# Patient Record
Sex: Female | Born: 1980 | Race: White | Hispanic: No | Marital: Married | Smoking: Current every day smoker
Health system: Southern US, Community
[De-identification: ages and names within clinical notes are randomized; demographics above are authoritative.]

## PROBLEM LIST (undated history)

## (undated) DIAGNOSIS — F419 Anxiety disorder, unspecified: Secondary | ICD-10-CM

## (undated) HISTORY — PX: HAND SURGERY: SHX662

## (undated) HISTORY — PX: LEG SURGERY: SHX1003

## (undated) HISTORY — PX: TONSILLECTOMY: SUR1361

## (undated) HISTORY — PX: OTHER SURGICAL HISTORY: SHX169

---

## 2002-01-05 ENCOUNTER — Other Ambulatory Visit: Admission: RE | Admit: 2002-01-05 | Discharge: 2002-01-05 | Payer: Self-pay | Admitting: Obstetrics and Gynecology

## 2002-01-26 ENCOUNTER — Inpatient Hospital Stay (HOSPITAL_COMMUNITY): Admission: AD | Admit: 2002-01-26 | Discharge: 2002-01-26 | Payer: Self-pay | Admitting: Obstetrics and Gynecology

## 2002-02-23 ENCOUNTER — Inpatient Hospital Stay (HOSPITAL_COMMUNITY): Admission: AD | Admit: 2002-02-23 | Discharge: 2002-02-23 | Payer: Self-pay | Admitting: Obstetrics and Gynecology

## 2002-03-17 ENCOUNTER — Inpatient Hospital Stay (HOSPITAL_COMMUNITY): Admission: AD | Admit: 2002-03-17 | Discharge: 2002-03-20 | Payer: Self-pay | Admitting: Obstetrics and Gynecology

## 2002-03-28 ENCOUNTER — Inpatient Hospital Stay (HOSPITAL_COMMUNITY): Admission: AD | Admit: 2002-03-28 | Discharge: 2002-03-28 | Payer: Self-pay | Admitting: Obstetrics and Gynecology

## 2002-05-07 ENCOUNTER — Other Ambulatory Visit: Admission: RE | Admit: 2002-05-07 | Discharge: 2002-05-07 | Payer: Self-pay | Admitting: Obstetrics and Gynecology

## 2003-06-01 ENCOUNTER — Other Ambulatory Visit: Admission: RE | Admit: 2003-06-01 | Discharge: 2003-06-01 | Payer: Self-pay | Admitting: Obstetrics and Gynecology

## 2004-06-07 ENCOUNTER — Emergency Department (HOSPITAL_COMMUNITY): Admission: AC | Admit: 2004-06-07 | Discharge: 2004-06-07 | Payer: Self-pay

## 2004-06-13 ENCOUNTER — Inpatient Hospital Stay (HOSPITAL_COMMUNITY): Admission: EM | Admit: 2004-06-13 | Discharge: 2004-06-16 | Payer: Self-pay | Admitting: Emergency Medicine

## 2004-06-13 ENCOUNTER — Ambulatory Visit: Payer: Self-pay | Admitting: Physical Medicine & Rehabilitation

## 2004-06-16 ENCOUNTER — Inpatient Hospital Stay (HOSPITAL_COMMUNITY)
Admission: RE | Admit: 2004-06-16 | Discharge: 2004-06-22 | Payer: Self-pay | Admitting: Physical Medicine & Rehabilitation

## 2004-09-20 ENCOUNTER — Emergency Department (HOSPITAL_COMMUNITY): Admission: EM | Admit: 2004-09-20 | Discharge: 2004-09-20 | Payer: Self-pay | Admitting: Emergency Medicine

## 2004-10-16 ENCOUNTER — Other Ambulatory Visit: Admission: RE | Admit: 2004-10-16 | Discharge: 2004-10-16 | Payer: Self-pay | Admitting: Obstetrics and Gynecology

## 2004-11-18 ENCOUNTER — Emergency Department (HOSPITAL_COMMUNITY): Admission: EM | Admit: 2004-11-18 | Discharge: 2004-11-18 | Payer: Self-pay | Admitting: Emergency Medicine

## 2005-05-27 ENCOUNTER — Emergency Department (HOSPITAL_COMMUNITY): Admission: EM | Admit: 2005-05-27 | Discharge: 2005-05-27 | Payer: Self-pay | Admitting: Emergency Medicine

## 2005-06-05 ENCOUNTER — Encounter: Admission: RE | Admit: 2005-06-05 | Discharge: 2005-09-03 | Payer: Self-pay | Admitting: Orthopedic Surgery

## 2005-09-06 ENCOUNTER — Ambulatory Visit (HOSPITAL_COMMUNITY): Admission: RE | Admit: 2005-09-06 | Discharge: 2005-09-06 | Payer: Self-pay | Admitting: Obstetrics and Gynecology

## 2006-03-19 ENCOUNTER — Emergency Department (HOSPITAL_COMMUNITY): Admission: EM | Admit: 2006-03-19 | Discharge: 2006-03-20 | Payer: Self-pay | Admitting: Emergency Medicine

## 2007-06-11 ENCOUNTER — Emergency Department (HOSPITAL_COMMUNITY): Admission: EM | Admit: 2007-06-11 | Discharge: 2007-06-11 | Payer: Self-pay | Admitting: Emergency Medicine

## 2007-09-06 ENCOUNTER — Inpatient Hospital Stay (HOSPITAL_COMMUNITY): Admission: AD | Admit: 2007-09-06 | Discharge: 2007-09-06 | Payer: Self-pay | Admitting: Obstetrics and Gynecology

## 2007-10-05 ENCOUNTER — Inpatient Hospital Stay (HOSPITAL_COMMUNITY): Admission: AD | Admit: 2007-10-05 | Discharge: 2007-10-11 | Payer: Self-pay | Admitting: Obstetrics and Gynecology

## 2007-10-07 ENCOUNTER — Encounter (INDEPENDENT_AMBULATORY_CARE_PROVIDER_SITE_OTHER): Payer: Self-pay | Admitting: Obstetrics and Gynecology

## 2008-10-23 ENCOUNTER — Ambulatory Visit: Payer: Self-pay | Admitting: Interventional Radiology

## 2008-10-23 ENCOUNTER — Emergency Department (HOSPITAL_BASED_OUTPATIENT_CLINIC_OR_DEPARTMENT_OTHER): Admission: EM | Admit: 2008-10-23 | Discharge: 2008-10-23 | Payer: Self-pay | Admitting: Emergency Medicine

## 2009-01-25 ENCOUNTER — Emergency Department (HOSPITAL_BASED_OUTPATIENT_CLINIC_OR_DEPARTMENT_OTHER): Admission: EM | Admit: 2009-01-25 | Discharge: 2009-01-25 | Payer: Self-pay | Admitting: Emergency Medicine

## 2009-01-25 ENCOUNTER — Ambulatory Visit: Payer: Self-pay | Admitting: Radiology

## 2009-02-01 ENCOUNTER — Emergency Department (HOSPITAL_BASED_OUTPATIENT_CLINIC_OR_DEPARTMENT_OTHER): Admission: EM | Admit: 2009-02-01 | Discharge: 2009-02-01 | Payer: Self-pay | Admitting: Emergency Medicine

## 2009-09-25 ENCOUNTER — Emergency Department (HOSPITAL_COMMUNITY): Admission: EM | Admit: 2009-09-25 | Discharge: 2009-09-25 | Payer: Self-pay | Admitting: Emergency Medicine

## 2010-02-15 ENCOUNTER — Inpatient Hospital Stay (HOSPITAL_COMMUNITY): Admission: AD | Admit: 2010-02-15 | Discharge: 2010-02-15 | Payer: Self-pay | Admitting: Obstetrics and Gynecology

## 2010-02-24 ENCOUNTER — Inpatient Hospital Stay (HOSPITAL_COMMUNITY): Admission: RE | Admit: 2010-02-24 | Discharge: 2010-02-27 | Payer: Self-pay | Admitting: Obstetrics and Gynecology

## 2010-06-13 LAB — CBC
HCT: 26.2 % — ABNORMAL LOW (ref 36.0–46.0)
HCT: 32.2 % — ABNORMAL LOW (ref 36.0–46.0)
Hemoglobin: 8.6 g/dL — ABNORMAL LOW (ref 12.0–15.0)
MCH: 28.6 pg (ref 26.0–34.0)
MCH: 28.8 pg (ref 26.0–34.0)
MCHC: 33 g/dL (ref 30.0–36.0)
MCV: 86.8 fL (ref 78.0–100.0)
Platelets: 241 10*3/uL (ref 150–400)
RBC: 3.71 MIL/uL — ABNORMAL LOW (ref 3.87–5.11)

## 2010-06-13 LAB — SURGICAL PCR SCREEN: Staphylococcus aureus: POSITIVE — AB

## 2010-07-09 LAB — PREGNANCY, URINE: Preg Test, Ur: NEGATIVE
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
## 2010-08-15 NOTE — Op Note (Signed)
NAME:  Monica Jones, Monica Jones NO.:  [id number]   MEDICAL RECORD NO.:  [id number]          PATIENT TYPE:  INP   LOCATION:  9306                          [facility]  [facility]   PHYSICIAN:  Huel Cote, M.D. DATE OF BIRTH:  05/01/1980   DATE OF PROCEDURE:  10/07/2007  DATE OF DISCHARGE:                               OPERATIVE REPORT   PREOPERATIVE DIAGNOSES:  1. Preterm gestation at 27-4/7th weeks.  2. Preterm labor failing tocolysis with magnesium.  3. History of prior cesarean section.  4. Uterine anomaly precluding vaginal delivery.   POSTOPERATIVE DIAGNOSES:  1. Preterm gestation at 27-4/7th weeks.  2. Preterm labor failing tocolysis with magnesium.  3. History of prior cesarean section.  4. Uterine anomaly precluding vaginal delivery.  5. Complete vertical septum noted through the uterus and the vagina      one uterine body.   PROCEDURE:  Repeat low-transverse C-section with 2-layer closure of the  uterus.   SURGEON:  Huel Cote, MD   ANESTHESIA:  Spinal.   FINDINGS:  As stated, there was one uterine corpus noted with normal  tubes and ovaries bilaterally.  There was a complete vertical septum  extending down through the fundus through the cervix and the vagina.  The pregnancy was noted to be primarily in the left side of the uterus,  however, the cervix was dilating on both sides of the septum.  There was  a viable female infant in vertex presentation.  Apgars were 9 and 9.  Weight was 1027 grams.   SPECIMEN:  Placenta to pathology.   ESTIMATED BLOOD LOSS:  750 mL.   URINE OUTPUT:  150 mL clear urine.   IV FLUIDS:  2800 mL LR.   PROCEDURE IN DETAIL:  The patient was taken to the operating room where  a spinal anesthesia was obtained without difficulty.  She was then  prepped and draped in normal sterile fashion in the dorsal supine  position with a leftward tilt.  A Pfannenstiel skin incision was then  made through a preexisting scar and carried  down to the underlying layer  of fascia by sharp dissection and Bovie cautery.  The fascia was then  nicked in the midline and incision was extended laterally with Mayo  scissors.  The inferior aspect was then grasped with Kocher clamps,  elevated, and dissected off the underlying rectus muscles.  In a similar  fashion, the superior aspect was dissected off the rectus muscles.  These were then separated in midline and the peritoneal cavity entered  bluntly.  There were some omental adhesions noted which were taken down  with Bovie cautery.  The peritoneal incision was then extended both  superiorly and inferiorly with careful attention to avoid both bowel and  bladder.  The Alexis self-retaining wound retractor was then placed  within the abdomen and the lower uterine segment exposed nicely.  The  bladder flap was then developed secondary to some adhesions of the  bladder and the bladder pushed well away from lower uterine segment.  The lower uterine segment appeared normal and was then opened in  transverse fashion  with a scalpel and the cavity itself entered bluntly.  This was then extended and the infant's head was elevated through the  incision and delivered without difficulty.  The remainder of the body  was delivered and the cord clamped and cut and handed off to waiting  pediatricians.  The placenta and infant were noted to come from the left  side of the uterus and the right side of the septum was an empty cavity  with no pregnancy tissue within it.  The uterus was cleared of all clots  and debris on both sides with a moist lap sponge.  The uterine septum  was noted to travel all the way down to the level of the incision and  appeared to go down to the level of the cervix as well.  The uterine  incision was then closed with 0 Vicryl in a running locked suture in one  layer and imbricating layer of the same in the second layer.  All  appeared hemostatic.  The uterus was then  returned to the abdomen and  the gutters cleared of all clots and debris with moist lap sponge.  The  fascia was inspected and the rectus muscles inspected and found to be  hemostatic.  The peritoneum was then closed with 0 Vicryl and several  interrupted sutures.  The fascia was closed with 0 Vicryl in a running  fashion and the skin was closed with staples.  Sponge, lap, and needle  counts were correct x2 and the patient was taken to the recovery room in  stable condition.      Huel Cote, M.D.  Electronically Signed     KR/MEDQ  D:  10/07/2007  T:  10/08/2007  Job:  [id number]
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
## 2010-08-18 NOTE — H&P (Signed)
   NAME:  Monica Jones, Monica Jones                        ACCOUNT NO.:  [id number]   MEDICAL RECORD NO.:  [id number]                   PATIENT TYPE:  INP   LOCATION:  9132                                 [facility]   PHYSICIAN:  Tracie Harrier, M.D.              DATE OF BIRTH:  [birth year]   DATE OF ADMISSION:  03/17/2002  DATE OF DISCHARGE:                                HISTORY & PHYSICAL   HISTORY OF PRESENT ILLNESS:  Ms. Benna Dunks is a 30 year old female, Gravida II,  Para 0, Ab 1 at [redacted] weeks gestation. The patient was admitted after  spontaneous rupture of membranes and spontaneous onset of labor today at  about 12:30. She had a large gush of clear fluid about 12:30 and began  contracting thereafter. She was initially scheduled for Cesarean section on  March 18, 2002 for breech presentation. She is now admitted for primary C-  section for spontaneous rupture of membranes and breech presentation and  known uterine abnormality.   OBSTETRIC LABORATORY:  Maternal blood type 0+, Rubella immune, Glucola  normal, group B strep unknown.   PAST MEDICAL HISTORY:  History of migraine headaches. History of known  uterine abnormality.   SURGICAL HISTORY:  1. TAB times one.  2. Tonsillectomy and adenoidectomy.  3. Ear surgery and tongue clipped.   OB HISTORY:  TAB times one at six weeks.   CURRENT MEDICATIONS:  Prenatal vitamins.   ALLERGIES:  No known drug allergies.   PHYSICAL EXAMINATION:  VITAL SIGNS: Stable. Temperature afebrile. Blood  pressure 108/60. Fetal heart tones are 140's.  GENERAL: A well developed, well nourished female in no acute distress.  HEENT:  Within normal limits.  NECK: Supple. Without adenopathy or thyromegaly.  HEART: Regular rate and rhythm. No murmur, rub, or gallop.  LUNGS: Clear.  BREASTS: Examination deferred.  ABDOMEN: Gravid and nontender.  EXTREMITIES: Normal.  NEURO: Grossly normal.  PELVIC: Normal external female genitalia noted. Obvious gross  rupture of  membranes is noted. Cervical examination per nurse examiner was 2 cm but I  did not examine the patient.   ADMISSION DIAGNOSES:  1. Intrauterine pregnancy at 38 weeks.  2. Spontaneous rupture of membranes.  3. Breech presentation.  4. Known uterine abnormality.   PLAN:  Primary low transverse C-section. The risks and benefits of this  surgery explained to the patient and her husband. Questions are answered.                                                 Tracie Harrier, M.D.    REG/MEDQ  D:  03/17/2002  T:  03/18/2002  Job:  [id number]

## 2010-08-18 NOTE — Op Note (Signed)
NAME:  Monica Jones, Monica Jones                        ACCOUNT NO.:  [id number]   MEDICAL RECORD NO.:  [id number]                   PATIENT TYPE:  INP   LOCATION:  9132                                 [facility]   PHYSICIAN:  Tracie Harrier, M.D.              DATE OF BIRTH:  April 18, 1980   DATE OF PROCEDURE:  04/01/2002  DATE OF DISCHARGE:                                 OPERATIVE REPORT   PREOPERATIVE DIAGNOSES:  1. Intrauterine pregnancy at 38 weeks.  2. Spontaneous rupture of membranes.  3. Breech presentation.  4. Known uterine abnormality.   POSTOPERATIVE DIAGNOSES:  1. Intrauterine pregnancy at 38 weeks.  2. Spontaneous rupture of membranes.  3. Footling breech presentation.  4. Known uterine abnormality.   PROCEDURE:  Primary low transverse cesarean section.   SURGEON:  Tracie Harrier, M.D.   ANESTHESIA:  Spinal.   ESTIMATED BLOOD LOSS:  500 cc.   COMPLICATIONS:  None.   FINDINGS:  At 31 through a low transverse uterine incision, a viable female  infant was delivered from the footling breech presentation.  This was done  with the usual breech maneuvers.  Weight is pending.  Apgars were 9 and 9.  Cord pH 7.26.   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  This extended  down the entire length to the incision of the uterus.   The ovaries and tubes were normal bilaterally.   DESCRIPTION OF PROCEDURE:  The patient was taken to the operating room where  a spinal anesthetic was administered.  The patient was placed on the  operating table in the left lateral tilt position.  The abdomen was prepped  and draped in the usual sterile fashion with Betadine and sterile drapes.  A  Foley catheter was inserted.  The abdomen was then entered through a  Pfannenstiel incision and carried down sharply in the usual fashion.  The  peritoneum was atraumatically entered.  The vesicouterine peritoneum  overlying the lower uterine segment was incised, and a bladder  flap was  bluntly and sharply created over the lower uterine segment.  A bladder blade  was then placed behind the bladder to ensure its protection during the  procedure.  The uterus was entered and the incision was carried out  laterally using the operating fingers.  The footling breech was delivered  with the usual breech maneuvers and delivered promptly and easily at 1429.  The oral and nasal pharynx were thoroughly bulb suctioned, and the cord  doubly clamped and cut.  The baby was handed promptly to the pediatricians.  There was noted upon entry into the uterus a scant amount of fluid.   The baby did well.  Apgars were 9 and 9.  Weight is pending.  The pH is  7.26.  As mentioned, a septate uterus was encountered.   The uterine incision was then closed in a two layer fashion.  The first  layer was a running interlocking suture of #1 Vicryl.  A second imbricating  suture was placed across the primary suture line with a running stitch of #1  Vicryl as well.  Multiple interrupted sutures of #1 Vicryl were used to  achieve hemostasis.  The pelvis was then thoroughly irrigated and noted to  be hemostatic.   Attention was then turned to closure.  The rectus muscle and anterior  peritoneum was closed with a running stitch of 0 Monocryl.  The fascia was  then closed with a running suture of #1 Vicryl.  The subcutaneous tissue was  irrigated and made hemostatic using Bovie cautery.  The skin was  reapproximated with staples, and a sterile dressing applied.   Final sponge, needle, and instrument counts were correct x3.  There were no  perioperative complications.                                               Tracie Harrier, M.D.    REG/MEDQ  D:  03/17/2002  T:  03/17/2002  Job:  [id number]

## 2010-08-18 NOTE — Discharge Summary (Signed)
NAME:  LUCILLE, WITTS                        ACCOUNT NO.:  [id number]   MEDICAL RECORD NO.:  [id number]                   PATIENT TYPE:  INP   LOCATION:  9132                                 [facility]   PHYSICIAN:  Duke Salvia. Marcelle Overlie, M.D.            DATE OF BIRTH:  07-08-1980   DATE OF ADMISSION:  03/17/2002  DATE OF DISCHARGE:  03/20/2002                                 DISCHARGE SUMMARY   ADMISSION DIAGNOSES:  1. Intrauterine pregnancy at 25 weeks estimated gestational age.  2. Spontaneous rupture of membranes.  3. Breech presentation.  4. Known uterine abnormality.   DISCHARGE DIAGNOSES:  1. Status post low transverse cesarean section.  2. Viable female infant.   PROCEDURES:  Primary low transverse cesarean section.   REASON FOR ADMISSION:  Please see dictated H&P.   HISTORY OF PRESENT ILLNESS:  The patient was a 30 year old, gravida 2, para  0, who was admitted to the [facility] at 38 weeks estimated  gestational age with spontaneous rupture of membranes and onset of labor.  The fetus was in the breech presentation and the patient had known septated  uterus.  Cesarean section was scheduled.   HOSPITAL COURSE:  The patient was taken to the operating room where a spinal  anesthesia was administered without difficulty.  A low transverse incision  was made with delivery of a viable female infant weighing 6 pounds 16 ounces  with Apgars of 9 at one minute and 9 at five minutes.  The umbilical cord pH  was 7.26.  The patient tolerated the procedure well and was taken to the  recovery room in stable condition.  On postoperative day #1, the patient had  good return of bowel function.  The abdomen was soft and the fundus was firm  and nontender.  The patient was tolerating a regular diet without complaints  of nausea or vomiting.  Labs revealed a hemoglobin of 8.8, a platelet count  of 171,000, and a WBC count of 13.0.  On postoperative day #2, the patient  was ambulating without assistance.  The abdominal dressing was removed,  revealing an incision that was clean, dry, and intact.  On postoperative day  #3, the patient was doing well.  She was tolerating pain medications with  good relief.  The abdomen was soft.  The incision was clean, dry, and  intact.  The staples were removed and the patient was discharged home.   CONDITION ON DISCHARGE:  Good.   DIET:  Regular as tolerated.   ACTIVITY:  No heaving lifting.  No driving x 2 weeks.  No vaginal entry.   FOLLOW-UP:  The patient is to follow up in the office in one to two weeks  for an incision check.  She is to call for a temperature greater than 100  degrees, persistent nausea and vomiting, heavy vaginal bleeding and/or  redness or drainage from the incision site.  DISCHARGE MEDICATIONS:  1. Percocet 5/325 mg, #30, one p.o. every four to six hours pain.  2. Motrin 600 mg one every six hours p.r.n.  3. Prenatal vitamins one p.o. daily.  4. Colace one p.o. daily p.r.n.     Julio Sicks, N.P.                        Richard M. Marcelle Overlie, M.D.    CC/MEDQ  D:  04/20/2002  T:  04/20/2002  Job:  [id number]

## 2010-12-25 LAB — URINALYSIS, ROUTINE W REFLEX MICROSCOPIC
Bilirubin Urine: NEGATIVE
Glucose, UA: NEGATIVE
Ketones, ur: NEGATIVE
pH: 6

## 2010-12-25 LAB — URINE MICROSCOPIC-ADD ON

## 2010-12-28 LAB — CBC
HCT: 32.4 — ABNORMAL LOW
HCT: 26.6 — ABNORMAL LOW
Hemoglobin: 11.2 — ABNORMAL LOW
Hemoglobin: 10.6 — ABNORMAL LOW
MCV: 93.8
MCV: 95.1
Platelets: 220
Platelets: 237
RBC: 2.8 — ABNORMAL LOW
RBC: 3.27 — ABNORMAL LOW
RBC: 3.56 — ABNORMAL LOW
WBC: 13.7 — ABNORMAL HIGH
WBC: 14.5 — ABNORMAL HIGH
WBC: 20.5 — ABNORMAL HIGH

## 2010-12-28 LAB — URINE MICROSCOPIC-ADD ON

## 2010-12-28 LAB — DIFFERENTIAL
Eosinophils Absolute: 0.1
Eosinophils Relative: 1
Lymphs Abs: 3.6
Monocytes Absolute: 1.2 — ABNORMAL HIGH

## 2010-12-28 LAB — URINALYSIS, ROUTINE W REFLEX MICROSCOPIC
Glucose, UA: NEGATIVE
Protein, ur: NEGATIVE
Specific Gravity, Urine: 1.015
pH: 7.5

## 2010-12-28 LAB — MAGNESIUM
Magnesium: 6.6
Magnesium: 6.9

## 2010-12-28 LAB — GC/CHLAMYDIA PROBE AMP, GENITAL: Chlamydia, DNA Probe: NEGATIVE

## 2010-12-28 LAB — WET PREP, GENITAL: Yeast Wet Prep HPF POC: NONE SEEN

## 2010-12-28 LAB — RPR: RPR Ser Ql: NONREACTIVE

## 2010-12-28 LAB — HEPATITIS B SURFACE ANTIGEN: Hepatitis B Surface Ag: NEGATIVE

## 2011-10-18 ENCOUNTER — Encounter (HOSPITAL_BASED_OUTPATIENT_CLINIC_OR_DEPARTMENT_OTHER): Payer: Self-pay | Admitting: *Deleted

## 2011-10-18 ENCOUNTER — Emergency Department (HOSPITAL_BASED_OUTPATIENT_CLINIC_OR_DEPARTMENT_OTHER): Payer: Medicaid Other

## 2011-10-18 ENCOUNTER — Emergency Department (HOSPITAL_BASED_OUTPATIENT_CLINIC_OR_DEPARTMENT_OTHER)
Admission: EM | Admit: 2011-10-18 | Discharge: 2011-10-19 | Disposition: A | Discharge status: Home / Self Care | Payer: Medicaid Other | Attending: Emergency Medicine | Admitting: Emergency Medicine

## 2011-10-18 DIAGNOSIS — S93609A Unspecified sprain of unspecified foot, initial encounter: Secondary | ICD-10-CM

## 2011-10-18 DIAGNOSIS — Z79899 Other long term (current) drug therapy: Secondary | ICD-10-CM | POA: Insufficient documentation

## 2011-10-18 DIAGNOSIS — X58XXXA Exposure to other specified factors, initial encounter: Secondary | ICD-10-CM | POA: Insufficient documentation

## 2011-10-18 DIAGNOSIS — Y93K1 Activity, walking an animal: Secondary | ICD-10-CM | POA: Insufficient documentation

## 2011-10-18 DIAGNOSIS — F17200 Nicotine dependence, unspecified, uncomplicated: Secondary | ICD-10-CM | POA: Insufficient documentation

## 2011-10-18 DIAGNOSIS — F411 Generalized anxiety disorder: Secondary | ICD-10-CM | POA: Insufficient documentation

## 2011-10-18 HISTORY — DX: Anxiety disorder, unspecified: F41.9

## 2011-10-18 NOTE — ED Notes (Signed)
Larey Seat in a hole this am and injury to her left foot.

## 2011-10-19 MED ORDER — IBUPROFEN 800 MG PO TABS
800.0000 mg | ORAL_TABLET | Freq: Once | ORAL | Status: AC
  Administered 2011-10-19: 800 mg via ORAL
  Filled 2011-10-19: qty 1

## 2011-10-19 MED ORDER — NAPROXEN 500 MG PO TABS: 500.0000 mg | ORAL_TABLET | Freq: Two times a day (BID) | ORAL | Status: AC | PRN

## 2011-10-19 MED ORDER — OXYCODONE-ACETAMINOPHEN 5-325 MG PO TABS
2.0000 | ORAL_TABLET | Freq: Once | ORAL | Status: AC
  Administered 2011-10-19: 2 via ORAL
  Filled 2011-10-19: qty 2

## 2011-10-19 MED ORDER — OXYCODONE-ACETAMINOPHEN 5-325 MG PO TABS: 1.0000 | ORAL_TABLET | ORAL | Status: AC | PRN
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
## 2014-11-11 ENCOUNTER — Encounter (HOSPITAL_COMMUNITY): Payer: Self-pay | Admitting: *Deleted

## 2014-11-11 ENCOUNTER — Emergency Department (HOSPITAL_COMMUNITY)
Admission: EM | Admit: 2014-11-11 | Discharge: 2014-11-12 | Disposition: A | Discharge status: Home / Self Care | Payer: Medicaid Other | Attending: Emergency Medicine | Admitting: Emergency Medicine

## 2014-11-11 DIAGNOSIS — F419 Anxiety disorder, unspecified: Secondary | ICD-10-CM | POA: Diagnosis not present

## 2014-11-11 DIAGNOSIS — Z720 Tobacco use: Secondary | ICD-10-CM | POA: Diagnosis not present

## 2014-11-11 DIAGNOSIS — Z79899 Other long term (current) drug therapy: Secondary | ICD-10-CM | POA: Insufficient documentation

## 2014-11-11 DIAGNOSIS — D509 Iron deficiency anemia, unspecified: Secondary | ICD-10-CM | POA: Diagnosis not present

## 2014-11-11 DIAGNOSIS — Z8759 Personal history of other complications of pregnancy, childbirth and the puerperium: Secondary | ICD-10-CM

## 2014-11-11 DIAGNOSIS — R42 Dizziness and giddiness: Secondary | ICD-10-CM | POA: Diagnosis present

## 2014-11-11 LAB — IRON AND TIBC
Iron: 12 ug/dL — ABNORMAL LOW (ref 28–170)
Saturation Ratios: 3 % — ABNORMAL LOW (ref 10.4–31.8)
TIBC: 370 ug/dL (ref 250–450)
UIBC: 358 ug/dL

## 2014-11-11 LAB — CBC WITH DIFFERENTIAL/PLATELET
BASOS ABS: 0 10*3/uL (ref 0.0–0.1)
BASOS PCT: 0 % (ref 0–1)
EOS ABS: 0.2 10*3/uL (ref 0.0–0.7)
Eosinophils Relative: 2 % (ref 0–5)
HCT: 24.6 % — ABNORMAL LOW (ref 36.0–46.0)
Hemoglobin: 8.3 g/dL — ABNORMAL LOW (ref 12.0–15.0)
Lymphocytes Relative: 44 % (ref 12–46)
Lymphs Abs: 4.4 10*3/uL — ABNORMAL HIGH (ref 0.7–4.0)
MCH: 29.7 pg (ref 26.0–34.0)
MCHC: 33.7 g/dL (ref 30.0–36.0)
MCV: 88.2 fL (ref 78.0–100.0)
Monocytes Absolute: 0.9 10*3/uL (ref 0.1–1.0)
Monocytes Relative: 9 % (ref 3–12)
NEUTROS PCT: 45 % (ref 43–77)
Neutro Abs: 4.5 10*3/uL (ref 1.7–7.7)
PLATELETS: 352 10*3/uL (ref 150–400)
RBC: 2.79 MIL/uL — AB (ref 3.87–5.11)
RDW: 15.6 % — AB (ref 11.5–15.5)
WBC: 10 10*3/uL (ref 4.0–10.5)

## 2014-11-11 LAB — URINALYSIS, ROUTINE W REFLEX MICROSCOPIC
BILIRUBIN URINE: NEGATIVE
Glucose, UA: NEGATIVE mg/dL
KETONES UR: NEGATIVE mg/dL
NITRITE: NEGATIVE
Protein, ur: NEGATIVE mg/dL
Specific Gravity, Urine: 1.008 (ref 1.005–1.030)
UROBILINOGEN UA: 0.2 mg/dL (ref 0.0–1.0)
pH: 6.5 (ref 5.0–8.0)

## 2014-11-11 LAB — RETICULOCYTES
RBC.: 2.77 MIL/uL — ABNORMAL LOW (ref 3.87–5.11)
Retic Count, Absolute: 80.3 K/uL (ref 19.0–186.0)
Retic Ct Pct: 2.9 % (ref 0.4–3.1)

## 2014-11-11 LAB — COMPREHENSIVE METABOLIC PANEL WITH GFR
ALT: 37 U/L (ref 14–54)
AST: 26 U/L (ref 15–41)
Albumin: 3.2 g/dL — ABNORMAL LOW (ref 3.5–5.0)
Alkaline Phosphatase: 56 U/L (ref 38–126)
Anion gap: 8 (ref 5–15)
BUN: 5 mg/dL — ABNORMAL LOW (ref 6–20)
CO2: 27 mmol/L (ref 22–32)
Calcium: 8.6 mg/dL — ABNORMAL LOW (ref 8.9–10.3)
Chloride: 101 mmol/L (ref 101–111)
Creatinine, Ser: 0.51 mg/dL (ref 0.44–1.00)
GFR calc Af Amer: >60 mL/min
GFR calc non Af Amer: >60 mL/min
Glucose, Bld: 112 mg/dL — ABNORMAL HIGH (ref 65–99)
Potassium: 3.3 mmol/L — ABNORMAL LOW (ref 3.5–5.1)
Sodium: 136 mmol/L (ref 135–145)
Total Bilirubin: 0.4 mg/dL (ref 0.3–1.2)
Total Protein: 6 g/dL — ABNORMAL LOW (ref 6.5–8.1)

## 2014-11-11 LAB — FOLATE: FOLATE: 12.5 ng/mL (ref >5.9)

## 2014-11-11 LAB — URINE MICROSCOPIC-ADD ON

## 2014-11-11 LAB — FERRITIN: Ferritin: 5 ng/mL — ABNORMAL LOW (ref 11–307)

## 2014-11-11 LAB — VITAMIN B12: Vitamin B-12: 514 pg/mL (ref 180–914)

## 2014-11-11 MED ORDER — FERROUS SULFATE 325 (65 FE) MG PO TABS
325.0000 mg | ORAL_TABLET | Freq: Once | ORAL | Status: AC
  Administered 2014-11-11: 325 mg via ORAL
  Filled 2014-11-11: qty 1

## 2014-11-11 MED ORDER — POTASSIUM CHLORIDE CRYS ER 20 MEQ PO TBCR
40.0000 meq | EXTENDED_RELEASE_TABLET | Freq: Once | ORAL | Status: AC
  Administered 2014-11-11: 40 meq via ORAL
  Filled 2014-11-11: qty 2

## 2014-11-11 MED ORDER — FERROUS SULFATE 325 (65 FE) MG PO TABS: ORAL_TABLET | ORAL | Status: DC

## 2014-11-11 MED ORDER — SODIUM CHLORIDE 0.9 % IV BOLUS (SEPSIS)
1000.0000 mL | Freq: Once | INTRAVENOUS | Status: AC
  Administered 2014-11-11: 1000 mL via INTRAVENOUS

## 2014-11-11 MED ORDER — POTASSIUM CHLORIDE CRYS ER 20 MEQ PO TBCR: 40.0000 meq | EXTENDED_RELEASE_TABLET | Freq: Every day | ORAL | Status: DC

## 2014-11-11 NOTE — ED Notes (Signed)
Patient stated she had a miscarriage on Saturday passing large clots.  Stated she laid in bed for 2 days, Monday she felt "OK" but Tuesday she started feeling light headed and dizzy with hurting in her chest and up the bad of her neck.  Also c/o constipation

## 2014-11-11 NOTE — ED Notes (Signed)
Discharge instructions/prescriptions reviewed with patient. Patient transferred via wheelchair for discharge. No acute distress noted.

## 2014-11-11 NOTE — Discharge Instructions (Signed)
Iron Deficiency Anemia Anemia is a condition in which there are less red blood cells or hemoglobin in the blood than normal. Hemoglobin is the part of red blood cells that carries oxygen. Iron deficiency anemia is anemia caused by too little iron. It is the most common type of anemia. It may leave you tired and short of breath. CAUSES   Lack of iron in the diet.  Poor absorption of iron, as seen with intestinal disorders.  Intestinal bleeding.  Heavy periods. SIGNS AND SYMPTOMS  Mild anemia may not be noticeable. Symptoms may include:  Fatigue.  Headache.  Pale skin.  Weakness.  Tiredness.  Shortness of breath.  Dizziness.  Cold hands and feet.  Fast or irregular heartbeat. DIAGNOSIS  Diagnosis requires a thorough evaluation and physical exam by your health care provider. Blood tests are generally used to confirm iron deficiency anemia. Additional tests may be done to find the underlying cause of your anemia. These may include:  Testing for blood in the stool (fecal occult blood test).  A procedure to see inside the colon and rectum (colonoscopy).  A procedure to see inside the esophagus and stomach (endoscopy). TREATMENT  Iron deficiency anemia is treated by correcting the cause of the deficiency. Treatment may involve:  Adding iron-rich foods to your diet.  Taking iron supplements. Pregnant or breastfeeding women need to take extra iron because their normal diet usually does not provide the required amount.  Taking vitamins. Vitamin C improves the absorption of iron. Your health care provider may recommend that you take your iron tablets with a glass of orange juice or vitamin C supplement.  Medicines to make heavy menstrual flow lighter.  Surgery. HOME CARE INSTRUCTIONS   Take iron as directed by your health care provider.  If you cannot tolerate taking iron supplements by mouth, talk to your health care provider about taking them through a vein  (intravenously) or an injection into a muscle.  For the best iron absorption, iron supplements should be taken on an empty stomach. If you cannot tolerate them on an empty stomach, you may need to take them with food.  Do not drink milk or take antacids at the same time as your iron supplements. Milk and antacids may interfere with the absorption of iron.  Iron supplements can cause constipation. Make sure to include fiber in your diet to prevent constipation. A stool softener may also be recommended.  Take vitamins as directed by your health care provider.  Eat a diet rich in iron. Foods high in iron include liver, lean beef, whole-grain bread, eggs, dried fruit, and dark green leafy vegetables. SEEK IMMEDIATE MEDICAL CARE IF:   You faint. If this happens, do not drive. Call your local emergency services (911 in [place] if no other help is available.  You have chest pain.  You feel nauseous or vomit.  You have severe or increased shortness of breath with activity.  You feel weak.  You have a rapid heartbeat.  You have unexplained sweating.  You become light-headed when getting up from a chair or bed. MAKE SURE YOU:   Understand these instructions.  Will watch your condition.  Will get help right away if you are not doing well or get worse. Document Released: 03/16/2000 Document Revised: 03/24/2013 Document Reviewed: 11/24/2012 [facility] Patient Information 2015 [place], [place]. This information is not intended to replace advice given to you by your health care provider. Make sure you discuss any questions you have with your health care provider.  Miscarriage A miscarriage is the sudden loss of an unborn baby (fetus) before the 20th week of pregnancy. Most miscarriages happen in the first 3 months of pregnancy. Sometimes, it happens before a woman even knows she is pregnant. A miscarriage is also called a "spontaneous miscarriage" or "early pregnancy loss." Having a miscarriage can  be an emotional experience. Talk with your caregiver about any questions you may have about miscarrying, the grieving process, and your future pregnancy plans. CAUSES   Problems with the fetal chromosomes that make it impossible for the baby to develop normally. Problems with the baby's genes or chromosomes are most often the result of errors that occur, by chance, as the embryo divides and grows. The problems are not inherited from the parents.  Infection of the cervix or uterus.   Hormone problems.   Problems with the cervix, such as having an incompetent cervix. This is when the tissue in the cervix is not strong enough to hold the pregnancy.   Problems with the uterus, such as an abnormally shaped uterus, uterine fibroids, or congenital abnormalities.   Certain medical conditions.   Smoking, drinking alcohol, or taking illegal drugs.   Trauma.  Often, the cause of a miscarriage is unknown.  SYMPTOMS   Vaginal bleeding or spotting, with or without cramps or pain.  Pain or cramping in the abdomen or lower back.  Passing fluid, tissue, or blood clots from the vagina. DIAGNOSIS  Your caregiver will perform a physical exam. You may also have an ultrasound to confirm the miscarriage. Blood or urine tests may also be ordered. TREATMENT   Sometimes, treatment is not necessary if you naturally pass all the fetal tissue that was in the uterus. If some of the fetus or placenta remains in the body (incomplete miscarriage), tissue left behind may become infected and must be removed. Usually, a dilation and curettage (D and C) procedure is performed. During a D and C procedure, the cervix is widened (dilated) and any remaining fetal or placental tissue is gently removed from the uterus.  Antibiotic medicines are prescribed if there is an infection. Other medicines may be given to reduce the size of the uterus (contract) if there is a lot of bleeding.  If you have Rh negative blood and  your baby was Rh positive, you will need a Rh immunoglobulin shot. This shot will protect any future baby from having Rh blood problems in future pregnancies. HOME CARE INSTRUCTIONS   Your caregiver may order bed rest or may allow you to continue light activity. Resume activity as directed by your caregiver.  Have someone help with home and family responsibilities during this time.   Keep track of the number of sanitary pads you use each day and how soaked (saturated) they are. Write down this information.   Do not use tampons. Do not douche or have sexual intercourse until approved by your caregiver.   Only take over-the-counter or prescription medicines for pain or discomfort as directed by your caregiver.   Do not take aspirin. Aspirin can cause bleeding.   Keep all follow-up appointments with your caregiver.   If you or your partner have problems with grieving, talk to your caregiver or seek counseling to help cope with the pregnancy loss. Allow enough time to grieve before trying to get pregnant again.  SEEK IMMEDIATE MEDICAL CARE IF:   You have severe cramps or pain in your back or abdomen.  You have a fever.  You pass large blood clots (  walnut-sized or larger) ortissue from your vagina. Save any tissue for your caregiver to inspect.   Your bleeding increases.   You have a thick, bad-smelling vaginal discharge.  You become lightheaded, weak, or you faint.   You have chills.  MAKE SURE YOU:  Understand these instructions.  Will watch your condition.  Will get help right away if you are not doing well or get worse. Document Released: 09/12/2000 Document Revised: 07/14/2012 Document Reviewed: 05/08/2011 [facility] Patient Information 2015 [place], [place]. This information is not intended to replace advice given to you by your health care provider. Make sure you discuss any questions you have with your health care provider.

## 2014-11-11 NOTE — ED Provider Notes (Signed)
CSN: [id number]     Arrival date & time 11/11/14  1854 History   First MD Initiated Contact with Patient 11/11/14 1958     Chief Complaint  Patient presents with  . Dizziness     (Consider location/radiation/quality/duration/timing/severity/associated sxs/prior Treatment) Patient is a 34 y.o. female presenting with dizziness. The history is provided by the patient.  Dizziness Quality:  Lightheadedness Severity:  Moderate Onset quality:  Gradual Duration:  4 days Timing:  Constant Progression:  Unchanged Chronicity:  New Context: bending over   Context: not with loss of consciousness   Context comment:  Recent miscarriage Relieved by:  Nothing Worsened by:  Nothing Ineffective treatments:  None tried Associated symptoms: no blood in stool   Associated symptoms comment:  Scant vaginal bleeding tapering off. Risk factors: anemia (known iron deficiency)      Past Medical History  Diagnosis Date  . Anxiety    Past Surgical History  Procedure Laterality Date  . Tonsillectomy    . Hand surgery    . Leg surgery    . C sections     No family history on file. Social History  Substance Use Topics  . Smoking status: Current Every Day Smoker -- 0.50 packs/day  . Smokeless tobacco: Never Used  . Alcohol Use: Yes   OB History    No data available     Review of Systems  Gastrointestinal: Negative for blood in stool.  Neurological: Positive for dizziness.  All other systems reviewed and are negative.     Allergies  Bee venom and Ultram  Home Medications   Prior to Admission medications   Medication Sig Start Date End Date Taking? Authorizing Provider  ALPRAZolam Prudy Feeler) 1 MG tablet Take 1 mg by mouth 3 (three) times daily. 12/03/13  Yes Historical Provider, MD  amphetamine-dextroamphetamine (ADDERALL XR) 25 MG 24 hr capsule Take 25 mg by mouth every morning.   Yes Historical Provider, MD  HYDROcodone-acetaminophen (NORCO) 10-325 MG per tablet Take 1 tablet by mouth  3 (three) times daily. 10/08/14  Yes Historical Provider, MD  tiZANidine (ZANAFLEX) 4 MG tablet Take 1 mg by mouth at bedtime.   Yes Historical Provider, MD  ferrous sulfate 325 (65 FE) MG tablet Take 1 tablet by mouth twice daily for 14 days then take 1 tablet daily 11/11/14   Lyndal Pulley, MD  potassium chloride SA (K-DUR,KLOR-CON) 20 MEQ tablet Take 2 tablets (40 mEq total) by mouth daily. 11/11/14   Lyndal Pulley, MD   BP 90/45 mmHg  Pulse 85  Temp(Src) 98.3 F (36.8 C) (Oral)  Resp 16  Ht  (1.6 m)  Wt 131 lb (59.421 kg)  BMI 23.21 kg/m2  SpO2 100%  LMP  (LMP Unknown) Physical Exam  Constitutional: She is oriented to person, place, and time. She appears well-developed and well-nourished. No distress.  HENT:  Head: Normocephalic.  Eyes: Conjunctivae are normal.  Neck: Neck supple. No tracheal deviation present.  Cardiovascular: Normal rate, regular rhythm and normal heart sounds.   Pulmonary/Chest: Effort normal and breath sounds normal. No respiratory distress.  Abdominal: Soft. She exhibits no distension.  Neurological: She is alert and oriented to person, place, and time. No cranial nerve deficit. GCS eye subscore is 4. GCS verbal subscore is 5. GCS motor subscore is 6.  Skin: Skin is warm and dry.  Psychiatric: She has a normal mood and affect.  Vitals reviewed.   ED Course  Procedures (including critical care time) Labs Review Labs Reviewed  COMPREHENSIVE  METABOLIC PANEL - Abnormal; Notable for the following:    Potassium 3.3 (*)    Glucose, Bld 112 (*)    BUN 5 (*)    Calcium 8.6 (*)    Total Protein 6.0 (*)    Albumin 3.2 (*)    All other components within normal limits  URINALYSIS, ROUTINE W REFLEX MICROSCOPIC (NOT AT [facility]) - Abnormal; Notable for the following:    APPearance CLOUDY (*)    Hgb urine dipstick LARGE (*)    Leukocytes, UA SMALL (*)    All other components within normal limits  CBC WITH DIFFERENTIAL/PLATELET - Abnormal; Notable for the following:     RBC 2.79 (*)    Hemoglobin 8.3 (*)    HCT 24.6 (*)    RDW 15.6 (*)    Lymphs Abs 4.4 (*)    All other components within normal limits  IRON AND TIBC - Abnormal; Notable for the following:    Iron 12 (*)    Saturation Ratios 3 (*)    All other components within normal limits  FERRITIN - Abnormal; Notable for the following:    Ferritin 5 (*)    All other components within normal limits  RETICULOCYTES - Abnormal; Notable for the following:    RBC. 2.77 (*)    All other components within normal limits  URINE MICROSCOPIC-ADD ON - Abnormal; Notable for the following:    Squamous Epithelial / LPF MANY (*)    All other components within normal limits  VITAMIN B12  FOLATE    Imaging Review No results found. Gregery Na, personally reviewed and evaluated these images and lab results as part of my medical decision-making.   EKG Interpretation   Date/Time:  Thursday November 11 2014 19:21:48 EDT Ventricular Rate:  96 PR Interval:  152 QRS Duration: 80 QT Interval:  346 QTC Calculation: 437 R Axis:   79 Text Interpretation:  Normal sinus rhythm Normal ECG Confirmed by Riham Polyakov  MD, Reuel Boom ([id number]) on 11/11/2014 10:34:11 PM      MDM   Final diagnoses:  Iron deficiency anemia  Miscarriage within last 15 months    34 year old female presents after having a recent miscarriage over the weekend and feeling increasingly weak, having headache, and feeling lightheaded. She appears to have a microcytic anemia is consistent with a prior known diagnosis of iron deficiency anemia and she has not been taking any iron supplementation at home nor has she had any primary care follow-up. With recent blood loss related to her miscarriage the smear present a new symptomatic anemia. She does not meet threshold for transfusion currently and she is an otherwise healthy young person without any signs of end organ damage or other complications. I recommended that she follow up with her primary care  physician and begin iron supplementation, an anemia panel was sent for baseline measurements so that new PCP may repeat studies a later date. Not currently orthostatic, actually her blood pressure is higher when standing, minimal vaginal bleeding reported and has been tapering off steadily. Patient stable for discharge.   Lyndal Pulley, MD 11/12/14 [phone]

## 2014-11-13 ENCOUNTER — Emergency Department (HOSPITAL_COMMUNITY): Payer: Medicaid Other

## 2014-11-13 ENCOUNTER — Ambulatory Visit (HOSPITAL_COMMUNITY)
Admission: EM | Admit: 2014-11-13 | Discharge: 2014-11-14 | Disposition: A | Discharge status: Home / Self Care | Payer: Medicaid Other | Attending: Obstetrics & Gynecology | Admitting: Obstetrics & Gynecology

## 2014-11-13 ENCOUNTER — Encounter (HOSPITAL_COMMUNITY): Payer: Self-pay | Admitting: Obstetrics & Gynecology

## 2014-11-13 DIAGNOSIS — Z885 Allergy status to narcotic agent status: Secondary | ICD-10-CM | POA: Diagnosis not present

## 2014-11-13 DIAGNOSIS — Z3A01 Less than 8 weeks gestation of pregnancy: Secondary | ICD-10-CM | POA: Insufficient documentation

## 2014-11-13 DIAGNOSIS — F17210 Nicotine dependence, cigarettes, uncomplicated: Secondary | ICD-10-CM | POA: Diagnosis not present

## 2014-11-13 DIAGNOSIS — R42 Dizziness and giddiness: Secondary | ICD-10-CM

## 2014-11-13 DIAGNOSIS — D649 Anemia, unspecified: Secondary | ICD-10-CM | POA: Diagnosis not present

## 2014-11-13 DIAGNOSIS — F419 Anxiety disorder, unspecified: Secondary | ICD-10-CM | POA: Insufficient documentation

## 2014-11-13 DIAGNOSIS — I959 Hypotension, unspecified: Secondary | ICD-10-CM

## 2014-11-13 DIAGNOSIS — N939 Abnormal uterine and vaginal bleeding, unspecified: Secondary | ICD-10-CM

## 2014-11-13 DIAGNOSIS — R531 Weakness: Secondary | ICD-10-CM

## 2014-11-13 DIAGNOSIS — D62 Acute posthemorrhagic anemia: Secondary | ICD-10-CM | POA: Diagnosis not present

## 2014-11-13 DIAGNOSIS — Z91030 Bee allergy status: Secondary | ICD-10-CM | POA: Insufficient documentation

## 2014-11-13 DIAGNOSIS — O031 Delayed or excessive hemorrhage following incomplete spontaneous abortion: Secondary | ICD-10-CM | POA: Diagnosis not present

## 2014-11-13 LAB — CBC
HEMATOCRIT: 24.8 % — AB (ref 36.0–46.0)
Hemoglobin: 8.2 g/dL — ABNORMAL LOW (ref 12.0–15.0)
MCH: 29.3 pg (ref 26.0–34.0)
MCHC: 33.1 g/dL (ref 30.0–36.0)
MCV: 88.6 fL (ref 78.0–100.0)
PLATELETS: 409 10*3/uL — AB (ref 150–400)
RBC: 2.8 MIL/uL — AB (ref 3.87–5.11)
RDW: 15.4 % (ref 11.5–15.5)
WBC: 12.2 10*3/uL — AB (ref 4.0–10.5)

## 2014-11-13 LAB — COMPREHENSIVE METABOLIC PANEL
ALBUMIN: 3.3 g/dL — AB (ref 3.5–5.0)
ALT: 26 U/L (ref 14–54)
ANION GAP: 9 (ref 5–15)
AST: 18 U/L (ref 15–41)
Alkaline Phosphatase: 56 U/L (ref 38–126)
BUN: 9 mg/dL (ref 6–20)
CO2: 25 mmol/L (ref 22–32)
CREATININE: 0.46 mg/dL (ref 0.44–1.00)
Calcium: 9.3 mg/dL (ref 8.9–10.3)
Chloride: 99 mmol/L — ABNORMAL LOW (ref 101–111)
GFR calc non Af Amer: >60 mL/min (ref >60)
Glucose, Bld: 128 mg/dL — ABNORMAL HIGH (ref 65–99)
Potassium: 4.2 mmol/L (ref 3.5–5.1)
SODIUM: 133 mmol/L — AB (ref 135–145)
Total Bilirubin: 0.3 mg/dL (ref 0.3–1.2)
Total Protein: 6.2 g/dL — ABNORMAL LOW (ref 6.5–8.1)

## 2014-11-13 LAB — URINALYSIS, ROUTINE W REFLEX MICROSCOPIC
Bilirubin Urine: NEGATIVE
Glucose, UA: NEGATIVE mg/dL
Ketones, ur: NEGATIVE mg/dL
Leukocytes, UA: NEGATIVE
Nitrite: NEGATIVE
Protein, ur: NEGATIVE mg/dL
SPECIFIC GRAVITY, URINE: 1.031 — AB (ref 1.005–1.030)
UROBILINOGEN UA: 0.2 mg/dL (ref 0.0–1.0)
pH: 5.5 (ref 5.0–8.0)

## 2014-11-13 LAB — URINE MICROSCOPIC-ADD ON

## 2014-11-13 LAB — I-STAT BETA HCG BLOOD, ED (MC, WL, AP ONLY)

## 2014-11-13 LAB — PROTIME-INR
INR: 1.06 (ref 0.00–1.49)
Prothrombin Time: 14 seconds (ref 11.6–15.2)

## 2014-11-13 LAB — ABO/RH: ABO/RH(D): O POS

## 2014-11-13 LAB — LIPASE, BLOOD: LIPASE: 15 U/L — AB (ref 22–51)

## 2014-11-13 MED ORDER — CITRIC ACID-SODIUM CITRATE 334-500 MG/5ML PO SOLN
30.0000 mL | ORAL | Status: DC
  Filled 2014-11-13: qty 30

## 2014-11-13 MED ORDER — SODIUM CHLORIDE 0.9 % IV SOLN
INTRAVENOUS | Status: DC
  Administered 2014-11-14: 01:00:00 via INTRAVENOUS

## 2014-11-13 MED ORDER — ONDANSETRON HCL 4 MG/2ML IJ SOLN
4.0000 mg | Freq: Once | INTRAMUSCULAR | Status: AC
  Administered 2014-11-13: 4 mg via INTRAVENOUS
  Filled 2014-11-13: qty 2

## 2014-11-13 MED ORDER — FENTANYL CITRATE (PF) 100 MCG/2ML IJ SOLN
50.0000 ug | Freq: Once | INTRAMUSCULAR | Status: AC
  Administered 2014-11-13: 50 ug via INTRAVENOUS
  Filled 2014-11-13: qty 2

## 2014-11-13 MED ORDER — SODIUM CHLORIDE 0.9 % IV SOLN
Freq: Once | INTRAVENOUS | Status: AC
  Administered 2014-11-13: 23:00:00 via INTRAVENOUS

## 2014-11-13 MED ORDER — SODIUM CHLORIDE 0.9 % IV BOLUS (SEPSIS)
1000.0000 mL | Freq: Once | INTRAVENOUS | Status: AC
  Administered 2014-11-13: 1000 mL via INTRAVENOUS

## 2014-11-13 MED ORDER — FAMOTIDINE IN NACL 20-0.9 MG/50ML-% IV SOLN
20.0000 mg | Freq: Once | INTRAVENOUS | Status: AC
  Administered 2014-11-14: 20 mg via INTRAVENOUS
  Filled 2014-11-13: qty 50

## 2014-11-13 MED ORDER — SODIUM CHLORIDE 0.9 % IV SOLN
Freq: Once | INTRAVENOUS | Status: AC
  Administered 2014-11-13: 20:00:00 via INTRAVENOUS

## 2014-11-13 MED ORDER — DEXTROSE 5 % IV SOLN
200.0000 mg | INTRAVENOUS | Status: DC
  Filled 2014-11-13 (×2): qty 200

## 2014-11-13 NOTE — ED Notes (Signed)
Patient arrives with complaint of vaginal bleeding. States miscarriage 7 days ago. Was seen on Thursday for dizziness and nausea, told she was iron deficient, and discharged with recommendations for self care. Presents tonight because she started bleeding heavily ago and has recurrence of cramping pain in abdomen.

## 2014-11-13 NOTE — ED Provider Notes (Addendum)
CSN: [id number]     Arrival date & time 11/13/14  1932 History   First MD Initiated Contact with Patient 11/13/14 2010     Chief Complaint  Patient presents with  . Vaginal Bleeding  . Abdominal Pain     (Consider location/radiation/quality/duration/timing/severity/associated sxs/prior Treatment) HPI   PCP: No PCP Per Patient Blood pressure 97/53, pulse 104, temperature 98.4 F (36.9 C), temperature source Oral, resp. rate 23, height 5\' 3"  (1.6 m), weight 130 lb (58.968 kg), SpO2 100 %.  Monica Jones is a 34 y.o.female with a significant PMH of tonsillectomy, hand surgery, leg surgery, c-section, miscarriage 11/06/2014  presents to the ER with complaints of vaginal bleeding. She reports being approx [redacted] weeks pregnant as confirmed by her PCP in [place], [place]. She has two children previously. On Saturday she developed suprapubic pain and vaginal bleeding. She believes to have seen products on conception pass. She was seen on 8/11 due to dizziness, fatigue, weakness and labs were checked -- she was noted to be anemic and started on iron supplements which she has been taking. At that visit the bleeding had tapered off for the most part. Then this morning she developed significant bleeding again, using 3 pads per hour. Her significant other reports her to be weak and near syncopal at rest. Pt is well appearing and does not appear in acute distress at this time with normal vital signs.  The patient denies diaphoresis, fever, headache, weakness  focal), confusion, change of vision,  neck pain, dysphagia, aphagia, chest pain, shortness of breath,  back pain, abdominal pains, nausea, vomiting, diarrhea, lower extremity swelling, rash.  Past Medical History  Diagnosis Date  . Anxiety    Past Surgical History  Procedure Laterality Date  . Tonsillectomy    . Hand surgery    . Leg surgery    . C sections     No family history on file. Social History  Substance Use Topics  . Smoking status:  Current Every Day Smoker -- 0.50 packs/day  . Smokeless tobacco: Never Used  . Alcohol Use: Yes   OB History    No data available     Review of Systems  10 Systems reviewed and are negative for acute change except as noted in the HPI.   Allergies  Bee venom; Adhesive; and Ultram  Home Medications   Prior to Admission medications   Medication Sig Start Date End Date Taking? Authorizing Provider  ALPRAZolam Prudy Feeler) 1 MG tablet Take 1 mg by mouth 3 (three) times daily. 12/03/13  Yes Historical Provider, MD  amphetamine-dextroamphetamine (ADDERALL XR) 25 MG 24 hr capsule Take 25 mg by mouth every morning.   Yes Historical Provider, MD  ferrous sulfate 325 (65 FE) MG tablet Take 1 tablet by mouth twice daily for 14 days then take 1 tablet daily Patient taking differently: Take 325 mg by mouth 2 (two) times daily with a meal.  11/11/14  Yes Lyndal Pulley, MD  HYDROcodone-acetaminophen (NORCO) 10-325 MG per tablet Take 1 tablet by mouth 3 (three) times daily. 10/08/14  Yes Historical Provider, MD  potassium chloride SA (K-DUR,KLOR-CON) 20 MEQ tablet Take 2 tablets (40 mEq total) by mouth daily. 11/11/14  Yes Lyndal Pulley, MD  tiZANidine (ZANAFLEX) 4 MG tablet Take 1 mg by mouth at bedtime as needed for muscle spasms.    Yes Historical Provider, MD   BP 98/51 mmHg  Pulse 81  Temp(Src) 98.4 F (36.9 C) (Oral)  Resp 16  Ht 5\' 3"  (  1.6 m)  Wt 130 lb (58.968 kg)  BMI 23.03 kg/m2  SpO2 100%  LMP  (LMP Unknown) Physical Exam  Constitutional: She appears well-developed and well-nourished. No distress.  HENT:  Head: Normocephalic and atraumatic.  Eyes: Pupils are equal, round, and reactive to light.  Neck: Normal range of motion. Neck supple.  Cardiovascular: Normal rate and regular rhythm.   Pulmonary/Chest: Effort normal.  Abdominal: Soft.  Genitourinary: Uterus is enlarged and tender. Cervix exhibits no motion tenderness, no discharge and no friability. Right adnexum displays no mass and no  tenderness. Left adnexum displays no mass and no tenderness. There is bleeding in the vagina. No erythema or tenderness in the vagina. No foreign body around the vagina. No signs of injury around the vagina. No vaginal discharge found.  Blood and clots noted to vaginal vault. Cervix is closed but small amount of blood coming through cervix.  Neurological: She is alert.  Skin: Skin is warm and dry.  Nursing note and vitals reviewed.   ED Course  Procedures (including critical care time) Labs Review Labs Reviewed  LIPASE, BLOOD - Abnormal; Notable for the following:    Lipase 15 (*)    All other components within normal limits  COMPREHENSIVE METABOLIC PANEL - Abnormal; Notable for the following:    Sodium 133 (*)    Chloride 99 (*)    Glucose, Bld 128 (*)    Total Protein 6.2 (*)    Albumin 3.3 (*)    All other components within normal limits  CBC - Abnormal; Notable for the following:    WBC 12.2 (*)    RBC 2.80 (*)    Hemoglobin 8.2 (*)    HCT 24.8 (*)    Platelets 409 (*)    All other components within normal limits  URINALYSIS, ROUTINE W REFLEX MICROSCOPIC (NOT AT [facility]) - Abnormal; Notable for the following:    Specific Gravity, Urine 1.031 (*)    Hgb urine dipstick LARGE (*)    All other components within normal limits  I-STAT BETA HCG BLOOD, ED ([facility], [facility], [facility] ONLY) - Abnormal; Notable for the following:    I-stat hCG, quantitative >2000.0 (*)    All other components within normal limits  PROTIME-INR  URINE MICROSCOPIC-ADD ON  TYPE AND SCREEN  ABO/RH  GC/CHLAMYDIA PROBE AMP () NOT AT [facility]    Imaging Review US Ob Comp Less 14 Wks  11/13/2014   CLINICAL DATA:  Recent spontaneous abortion. Heavy vaginal bleeding.  EXAM: OBSTETRIC <14 WK [place] AND TRANSVAGINAL OB US  TECHNIQUE: Both transabdominal and transvaginal ultrasound examinations were performed for complete evaluation of the gestation as well as the maternal uterus, adnexal regions, and pelvic cul-de-sac.  Transvaginal technique was performed to assess early pregnancy.  COMPARISON:  None.  FINDINGS: Intrauterine gestational sac: No gestational sac is present.  Maternal uterus/adnexae: Heterogeneous soft tissue is present within the endometrial canal measuring 6.8 x 3.0 x 5.6 cm. There is no significant color Doppler flow over this area.  The right ovary is of normal size and echotexture measuring 2.1 x 1.4 x 2.1 cm. The left ovary is of normal size and echotexture measuring 1.9 x 1.3 x 1.9 cm.  Trace free fluid is present.  IMPRESSION: 1. Heterogeneous soft tissue within the endometrial canal without a gestational sac. There is no increased flow. This likely represents residual blood products/hematoma. 2. Trace free fluid.   Electronically Signed   By: Marin Roberts M.D.   On: 11/13/2014 22:54  US Ob Transvaginal  11/13/2014   CLINICAL DATA:  Recent spontaneous abortion. Heavy vaginal bleeding.  EXAM: OBSTETRIC <14 WK [place] AND TRANSVAGINAL OB US  TECHNIQUE: Both transabdominal and transvaginal ultrasound examinations were performed for complete evaluation of the gestation as well as the maternal uterus, adnexal regions, and pelvic cul-de-sac. Transvaginal technique was performed to assess early pregnancy.  COMPARISON:  None.  FINDINGS: Intrauterine gestational sac: No gestational sac is present.  Maternal uterus/adnexae: Heterogeneous soft tissue is present within the endometrial canal measuring 6.8 x 3.0 x 5.6 cm. There is no significant color Doppler flow over this area.  The right ovary is of normal size and echotexture measuring 2.1 x 1.4 x 2.1 cm. The left ovary is of normal size and echotexture measuring 1.9 x 1.3 x 1.9 cm.  Trace free fluid is present.  IMPRESSION: 1. Heterogeneous soft tissue within the endometrial canal without a gestational sac. There is no increased flow. This likely represents residual blood products/hematoma. 2. Trace free fluid.   Electronically Signed   By: Marin Roberts  M.D.   On: 11/13/2014 22:54   I, Dorthula Matas, personally reviewed and evaluated these images and lab results as part of my medical decision-making.   EKG Interpretation None      MDM   Final diagnoses:  Weakness  Dizziness  Hypotension, unspecified hypotension type    Patient has significant amount of blood and clots in vaginal vault. She continues to feel dizzy and weak. In the ED her BP started to drop to 79/40 - 89/27. With two liters of fluids it improved to 98/51. She has admitted the bleeding has slowed down somewhat.  Her hemoglobin has dropped from 8.3 to 8.2.   Dr. Radford Pax has seen patient as seen patient as well and feels that she needs to be transferred to [facility]. Pt may possibly need D&C or obs. Dr. Lovett Sox has agreed to accept patient to [facility]. To be transferred there to the MAU.  Filed Vitals:   11/13/14 2315  BP: 98/51  Pulse: 81  Temp:   Resp: 16    Discussed plan with the patient who is agreeable for transfer.  After the fluids the patients blood pressure improved, however on repeat CBC,  her hemoglobin decreased to 6.6.  2 units PRBC emergent release ordered prior to [facility] taking patient to [facility].   .CRITICAL CARE Performed by: Dorthula Matas Total critical care time: 30 Critical care time was exclusive of separately billable procedures and treating other patients. Critical care was necessary to treat or prevent imminent or life-threatening deterioration. Critical care was time spent personally by me on the following activities: development of treatment plan with patient and/or surrogate as well as nursing, discussions with consultants, evaluation of patient's response to treatment, examination of patient, obtaining history from patient or surrogate, ordering and performing treatments and interventions, ordering and review of laboratory studies, ordering and review of radiographic studies, pulse oximetry and re-evaluation of  patient's condition.  Filed Vitals:   11/14/14 0037  BP: 91/47  Pulse: 93  Temp:   Resp: [address], PA-C 11/14/14 0104  Nelva Nay, MD 11/14/14 [id number]

## 2014-11-13 NOTE — H&P (Signed)
Preoperative History and Physical  Monica Jones is a 34 y.o. [id number] here for surgical management of retained POCs after recent SAB, associated with heavy bleeding and symptomatic anemia.  Presented to [facility] ED with complaints of vaginal bleeding on the evening of 11/13/14. She reported that she was evaluated by her PCP in [place] on 11/11/14 and was told she was [redacted] weeks pregnant.  She went in to be evaluated for vaginal bleeding, dizziness, fatigue, weakness and labs were checked -- she was noted to be anemic with Hgb of 8.3 and started on iron supplements which she has been taking.  She feels the bleeding tapered off after this visit.  However, on 11/13/14, she developed suprapubic pain and significant vaginal bleeding and believed that she passed products of conception. She reported going through 3 pads per hour. In the [facility] ED, she was noted to be hypotensive and was given fluid boluses which initially helped improve her vital signs.  Moderate vaginal bleeding was noted, but hemoglobin was initially noted to be 8.2 then decreased to 6.6.  The plan was then made to transfer her to [facility] for further evaluation and urgent surgical management; two units of pRBCs were ordered for transfusion.   Proposed surgery: Dilation and Evacution  Past Medical History  Diagnosis Date  . Anxiety    Past Surgical History  Procedure Laterality Date  . Tonsillectomy    . Hand surgery    . Leg surgery    . C sections     OB History  Gravida Para Term Preterm AB SAB TAB Ectopic Multiple Living  3 2 2  1 1    2     # Outcome Date GA Lbr Len/2nd Weight Sex Delivery Anes PTL Lv  3 SAB 11/13/14     SAB     2 Term      CS-LTranv   Y  1 Term      CS-LTranv   Y    Patient denies any other pertinent gynecologic issues.   No current facility-administered medications on file prior to encounter.   Current Outpatient Prescriptions on File Prior to Encounter  Medication Sig Dispense Refill  . ALPRAZolam (XANAX) 1 MG  tablet Take 1 mg by mouth 3 (three) times daily.    Marland Kitchen amphetamine-dextroamphetamine (ADDERALL XR) 25 MG 24 hr capsule Take 25 mg by mouth every morning.    . ferrous sulfate 325 (65 FE) MG tablet Take 1 tablet by mouth twice daily for 14 days then take 1 tablet daily (Patient taking differently: Take 325 mg by mouth 2 (two) times daily with a meal. ) 60 tablet 3  . HYDROcodone-acetaminophen (NORCO) 10-325 MG per tablet Take 1 tablet by mouth 3 (three) times daily.  0  . potassium chloride SA (K-DUR,KLOR-CON) 20 MEQ tablet Take 2 tablets (40 mEq total) by mouth daily. 6 tablet 0  . tiZANidine (ZANAFLEX) 4 MG tablet Take 1 mg by mouth at bedtime as needed for muscle spasms.      Allergies  Allergen Reactions  . Bee Venom Anaphylaxis  . Adhesive [Tape] Other (See Comments)    Tears off skin  . Ultram [Tramadol] Hives    Social History:   reports that she has been smoking.  She has never used smokeless tobacco. She reports that she drinks alcohol. She reports that she does not use illicit drugs.  No family history on file.  Review of Systems: Noncontributory  PHYSICAL EXAM: Blood pressure 85/43, pulse 92, temperature 98.2 F (  36.8 C), temperature source Oral, resp. rate 16, height  (1.6 m), weight 130 lb (58.968 kg), SpO2 100 %. CONSTITUTIONAL: Well-developed, well-nourished female in no acute distress.  HENT:  Normocephalic, atraumatic, External right and left ear normal. Oropharynx is clear and moist EYES: Conjunctivae and EOM are normal. Pupils are equal, round, and reactive to light. No scleral icterus.  NECK: Normal range of motion, supple, no masses SKIN: Skin is warm and dry. No rash noted. Not diaphoretic. No erythema. No pallor. NEUROLGIC: Alert and oriented to person, place, and time. Normal reflexes, muscle tone coordination. No cranial nerve deficit noted. PSYCHIATRIC: Normal mood and affect. Normal behavior. Normal judgment and thought content. CARDIOVASCULAR: Normal  heart rate noted, regular rhythm RESPIRATORY: Effort and breath sounds normal, no problems with respiration noted ABDOMEN: Soft, nontender, nondistended. PELVIC: Moderate vaginal bleeding noted MUSCULOSKELETAL: Normal range of motion. No edema and no tenderness. 2+ distal pulses.  Labs: Results for orders placed or performed during the hospital encounter of 11/13/14 (from the past 336 hour(s))  Lipase, blood   Collection Time: 11/13/14  7:46 PM  Result Value Ref Range   Lipase 15 (L) 22 - 51 U/L  Comprehensive metabolic panel   Collection Time: 11/13/14  7:46 PM  Result Value Ref Range   Sodium 133 (L) 135 - 145 mmol/L   Potassium 4.2 3.5 - 5.1 mmol/L   Chloride 99 (L) 101 - 111 mmol/L   CO2 25 22 - 32 mmol/L   Glucose, Bld 128 (H) 65 - 99 mg/dL   BUN 9 6 - 20 mg/dL   Creatinine, Ser 1.61 0.44 - 1.00 mg/dL   Calcium 9.3 8.9 - 09.6 mg/dL   Total Protein 6.2 (L) 6.5 - 8.1 g/dL   Albumin 3.3 (L) 3.5 - 5.0 g/dL   AST 18 15 - 41 U/L   ALT 26 14 - 54 U/L   Alkaline Phosphatase 56 38 - 126 U/L   Total Bilirubin 0.3 0.3 - 1.2 mg/dL   GFR calc non Af Amer >60 >60 mL/min   GFR calc Af Amer >60 >60 mL/min   Anion gap 9 5 - 15  CBC   Collection Time: 11/13/14  7:46 PM  Result Value Ref Range   WBC 12.2 (H) 4.0 - 10.5 K/uL   RBC 2.80 (L) 3.87 - 5.11 MIL/uL   Hemoglobin 8.2 (L) 12.0 - 15.0 g/dL   HCT 04.5 (L) 40.9 - 81.1 %   MCV 88.6 78.0 - 100.0 fL   MCH 29.3 26.0 - 34.0 pg   MCHC 33.1 30.0 - 36.0 g/dL   RDW 91.4 78.2 - 95.6 %   Platelets 409 (H) 150 - 400 K/uL  Urinalysis, Routine w reflex microscopic (not at [facility])   Collection Time: 11/13/14  7:46 PM  Result Value Ref Range   Color, Urine YELLOW YELLOW   APPearance CLEAR CLEAR   Specific Gravity, Urine 1.031 (H) 1.005 - 1.030   pH 5.5 5.0 - 8.0   Glucose, UA NEGATIVE NEGATIVE mg/dL   Hgb urine dipstick LARGE (A) NEGATIVE   Bilirubin Urine NEGATIVE NEGATIVE   Ketones, ur NEGATIVE NEGATIVE mg/dL   Protein, ur NEGATIVE  NEGATIVE mg/dL   Urobilinogen, UA 0.2 0.0 - 1.0 mg/dL   Nitrite NEGATIVE NEGATIVE   Leukocytes, UA NEGATIVE NEGATIVE  Protime-INR   Collection Time: 11/13/14  7:46 PM  Result Value Ref Range   Prothrombin Time 14.0 11.6 - 15.2 seconds   INR 1.06 0.00 - 1.49  Urine microscopic-add  on   Collection Time: 11/13/14  7:46 PM  Result Value Ref Range   Squamous Epithelial / LPF RARE RARE   WBC, UA 0-2 <3 WBC/hpf   RBC / HPF TOO NUMEROUS TO COUNT <3 RBC/hpf  I-Stat beta hCG blood, ED ([facility], [facility], [facility] only)   Collection Time: 11/13/14  7:56 PM  Result Value Ref Range   I-stat hCG, quantitative >2000.0 (H) <5 mIU/mL   Comment 3          Type and screen for Red Blood Exchange   Collection Time: 11/13/14  8:30 PM  Result Value Ref Range   ABO/RH(D) O POS    Antibody Screen NEG    Sample Expiration 11/16/2014    Unit Number [id number]    Blood Component Type RED CELLS,LR    Unit division 00    Status of Unit ALLOCATED    Transfusion Status OK TO TRANSFUSE    Crossmatch Result Compatible    Unit Number [id number]    Blood Component Type RED CELLS,LR    Unit division 00    Status of Unit ALLOCATED    Transfusion Status OK TO TRANSFUSE    Crossmatch Result Compatible    Unit Number [id number]    Blood Component Type RED CELLS,LR    Unit division 00    Status of Unit ISSUED    Transfusion Status OK TO TRANSFUSE    Crossmatch Result Compatible    Unit Number [id number]    Blood Component Type RED CELLS,LR    Unit division 00    Status of Unit ISSUED    Transfusion Status OK TO TRANSFUSE    Crossmatch Result Compatible   ABO/Rh   Collection Time: 11/13/14  8:30 PM  Result Value Ref Range   ABO/RH(D) O POS   CBC   Collection Time: 11/14/14 12:01 AM  Result Value Ref Range   WBC 13.5 (H) 4.0 - 10.5 K/uL   RBC 2.31 (L) 3.87 - 5.11 MIL/uL   Hemoglobin 6.6 (LL) 12.0 - 15.0 g/dL   HCT 72.5 (L) 36.6 - 44.0 %   MCV 86.6 78.0 - 100.0 fL   MCH 28.6 26.0 - 34.0 pg   MCHC 33.0  30.0 - 36.0 g/dL   RDW 34.7 (H) 42.5 - 95.6 %   Platelets 305 150 - 400 K/uL  Prepare RBC   Collection Time: 11/14/14 12:10 AM  Result Value Ref Range   Order Confirmation ORDER PROCESSED BY BLOOD BANK   Prepare RBC   Collection Time: 11/14/14 12:50 AM  Result Value Ref Range   Order Confirmation ORDER PROCESSED BY BLOOD BANK   Prepare RBC   Collection Time: 11/14/14 12:50 AM  Result Value Ref Range   Order Confirmation ORDER PROCESSED BY BLOOD BANK   Results for orders placed or performed during the hospital encounter of 11/11/14 (from the past 336 hour(s))  Comprehensive metabolic panel   Collection Time: 11/11/14  7:51 PM  Result Value Ref Range   Sodium 136 135 - 145 mmol/L   Potassium 3.3 (L) 3.5 - 5.1 mmol/L   Chloride 101 101 - 111 mmol/L   CO2 27 22 - 32 mmol/L   Glucose, Bld 112 (H) 65 - 99 mg/dL   BUN 5 (L) 6 - 20 mg/dL   Creatinine, Ser 3.87 0.44 - 1.00 mg/dL   Calcium 8.6 (L) 8.9 - 10.3 mg/dL   Total Protein 6.0 (L) 6.5 - 8.1 g/dL   Albumin 3.2 (L) 3.5 - 5.0 g/dL  AST 26 15 - 41 U/L   ALT 37 14 - 54 U/L   Alkaline Phosphatase 56 38 - 126 U/L   Total Bilirubin 0.4 0.3 - 1.2 mg/dL   GFR calc non Af Amer >60 >60 mL/min   GFR calc Af Amer >60 >60 mL/min   Anion gap 8 5 - 15  CBC with Differential   Collection Time: 11/11/14  7:51 PM  Result Value Ref Range   WBC 10.0 4.0 - 10.5 K/uL   RBC 2.79 (L) 3.87 - 5.11 MIL/uL   Hemoglobin 8.3 (L) 12.0 - 15.0 g/dL   HCT 16.1 (L) 09.6 - 04.5 %   MCV 88.2 78.0 - 100.0 fL   MCH 29.7 26.0 - 34.0 pg   MCHC 33.7 30.0 - 36.0 g/dL   RDW 40.9 (H) 81.1 - 91.4 %   Platelets 352 150 - 400 K/uL   Neutrophils Relative % 45 43 - 77 %   Neutro Abs 4.5 1.7 - 7.7 K/uL   Lymphocytes Relative 44 12 - 46 %   Lymphs Abs 4.4 (H) 0.7 - 4.0 K/uL   Monocytes Relative 9 3 - 12 %   Monocytes Absolute 0.9 0.1 - 1.0 K/uL   Eosinophils Relative 2 0 - 5 %   Eosinophils Absolute 0.2 0.0 - 0.7 K/uL   Basophils Relative 0 0 - 1 %   Basophils  Absolute 0.0 0.0 - 0.1 K/uL  Urinalysis, Routine w reflex microscopic (not at [facility])   Collection Time: 11/11/14  9:33 PM  Result Value Ref Range   Color, Urine YELLOW YELLOW   APPearance CLOUDY (A) CLEAR   Specific Gravity, Urine 1.008 1.005 - 1.030   pH 6.5 5.0 - 8.0   Glucose, UA NEGATIVE NEGATIVE mg/dL   Hgb urine dipstick LARGE (A) NEGATIVE   Bilirubin Urine NEGATIVE NEGATIVE   Ketones, ur NEGATIVE NEGATIVE mg/dL   Protein, ur NEGATIVE NEGATIVE mg/dL   Urobilinogen, UA 0.2 0.0 - 1.0 mg/dL   Nitrite NEGATIVE NEGATIVE   Leukocytes, UA SMALL (A) NEGATIVE  Urine microscopic-add on   Collection Time: 11/11/14  9:33 PM  Result Value Ref Range   Squamous Epithelial / LPF MANY (A) RARE   WBC, UA 3-6 <3 WBC/hpf   RBC / HPF 11-20 <3 RBC/hpf   Bacteria, UA RARE RARE  Vitamin B12   Collection Time: 11/11/14 10:22 PM  Result Value Ref Range   Vitamin B-12 514 180 - 914 pg/mL  Folate   Collection Time: 11/11/14 10:22 PM  Result Value Ref Range   Folate 12.5 >5.9 ng/mL  Iron and TIBC   Collection Time: 11/11/14 10:22 PM  Result Value Ref Range   Iron 12 (L) 28 - 170 ug/dL   TIBC 782 956 - 213 ug/dL   Saturation Ratios 3 (L) 10.4 - 31.8 %   UIBC 358 ug/dL  Ferritin   Collection Time: 11/11/14 10:22 PM  Result Value Ref Range   Ferritin 5 (L) 11 - 307 ng/mL  Reticulocytes   Collection Time: 11/11/14 10:22 PM  Result Value Ref Range   Retic Ct Pct 2.9 0.4 - 3.1 %   RBC. 2.77 (L) 3.87 - 5.11 MIL/uL   Retic Count, Manual 80.3 19.0 - 186.0 K/uL    Imaging Studies: 11/13/2014   OBSTETRIC <14 WK [place] AND TRANSVAGINAL OB US  CLINICAL DATA:  Recent spontaneous abortion. Heavy vaginal bleeding.  COMPARISON:  None.  FINDINGS: Intrauterine gestational sac: No gestational sac is present.  Maternal uterus/adnexae: Heterogeneous  soft tissue is present within the endometrial canal measuring 6.8 x 3.0 x 5.6 cm. There is no significant color Doppler flow over this area.  The right ovary is of  normal size and echotexture measuring 2.1 x 1.4 x 2.1 cm. The left ovary is of normal size and echotexture measuring 1.9 x 1.3 x 1.9 cm.  Trace free fluid is present.  IMPRESSION: 1. Heterogeneous soft tissue within the endometrial canal without a gestational sac. There is no increased flow. This likely represents residual blood products/hematoma. 2. Trace free fluid.   Electronically Signed   By: Marin Roberts M.D.   On: 11/13/2014 22:54    Assessment: Patient Active Problem List   Diagnosis Date Noted  . Incomplete abortion with excessive hemorrhage 11/14/2014    Plan: Patient's presentation is concerning for incomplete abortion complicated by excessive bleeding; she was counseled regarding need for D&E.  Risks of surgery including bleeding, infection, injury to surrounding organs, need for additional procedures, possibility of intrauterine scarring which may impair future fertility, risk of retained products which may require further management and other postoperative/anesthesia complications were explained to patient.  Patient received transfusion of 2 units of pRBCs while at [facility] ED and may need to receive more.  Likelihood of success of complete evacuation of the uterus was discussed with the patient.  Written informed consent was obtained.  Patient has been NPO since 1200 11/13/14, she will remain NPO for procedure. Anesthesia and OR aware. She has received prophylactic Doxycycline 200mg  IV.  To OR when ready.     Jaynie Collins, M.D. 11/14/2014 1:39 AM

## 2014-11-13 NOTE — ED Notes (Signed)
Tiffany PA at bedside  

## 2014-11-13 NOTE — ED Notes (Signed)
Patient transported to Ultrasound 

## 2014-11-14 ENCOUNTER — Emergency Department (HOSPITAL_COMMUNITY): Payer: Medicaid Other | Admitting: Anesthesiology

## 2014-11-14 ENCOUNTER — Encounter (HOSPITAL_COMMUNITY)
Admission: EM | Disposition: A | Discharge status: Home / Self Care | Payer: Self-pay | Source: Home / Self Care | Attending: Emergency Medicine

## 2014-11-14 DIAGNOSIS — D62 Acute posthemorrhagic anemia: Secondary | ICD-10-CM

## 2014-11-14 DIAGNOSIS — O031 Delayed or excessive hemorrhage following incomplete spontaneous abortion: Secondary | ICD-10-CM | POA: Diagnosis not present

## 2014-11-14 DIAGNOSIS — D649 Anemia, unspecified: Secondary | ICD-10-CM | POA: Diagnosis not present

## 2014-11-14 DIAGNOSIS — Z3A01 Less than 8 weeks gestation of pregnancy: Secondary | ICD-10-CM | POA: Diagnosis not present

## 2014-11-14 DIAGNOSIS — F17210 Nicotine dependence, cigarettes, uncomplicated: Secondary | ICD-10-CM | POA: Diagnosis not present

## 2014-11-14 HISTORY — PX: DILATION AND EVACUATION: SHX1459

## 2014-11-14 LAB — CBC
HCT: 20 % — ABNORMAL LOW (ref 36.0–46.0)
HEMOGLOBIN: 6.6 g/dL — AB (ref 12.0–15.0)
MCH: 28.6 pg (ref 26.0–34.0)
MCHC: 33 g/dL (ref 30.0–36.0)
MCV: 86.6 fL (ref 78.0–100.0)
PLATELETS: 305 10*3/uL (ref 150–400)
RBC: 2.31 MIL/uL — AB (ref 3.87–5.11)
RDW: 15.6 % — ABNORMAL HIGH (ref 11.5–15.5)
WBC: 13.5 10*3/uL — AB (ref 4.0–10.5)

## 2014-11-14 LAB — PREPARE RBC (CROSSMATCH)

## 2014-11-14 SURGERY — DILATION AND EVACUATION, UTERUS
Anesthesia: Choice

## 2014-11-14 MED ORDER — MIDAZOLAM HCL 2 MG/2ML IJ SOLN
INTRAMUSCULAR | Status: DC | PRN
  Administered 2014-11-14 (×2): 1 mg via INTRAVENOUS

## 2014-11-14 MED ORDER — LACTATED RINGERS IV SOLN
INTRAVENOUS | Status: DC | PRN
  Administered 2014-11-14: 02:00:00 via INTRAVENOUS

## 2014-11-14 MED ORDER — DOCUSATE SODIUM 100 MG PO CAPS: 100.0000 mg | ORAL_CAPSULE | Freq: Two times a day (BID) | ORAL | Status: DC | PRN

## 2014-11-14 MED ORDER — SODIUM CHLORIDE 0.9 % IV SOLN: 10.0000 mL/h | Freq: Once | INTRAVENOUS | Status: DC

## 2014-11-14 MED ORDER — PROPOFOL 500 MG/50ML IV EMUL
INTRAVENOUS | Status: DC | PRN
  Administered 2014-11-14: 50 mg via INTRAVENOUS
  Administered 2014-11-14 (×3): 30 mg via INTRAVENOUS

## 2014-11-14 MED ORDER — ACETAMINOPHEN 160 MG/5ML PO SOLN: 325.0000 mg | ORAL | Status: DC | PRN

## 2014-11-14 MED ORDER — ONDANSETRON HCL 4 MG/2ML IJ SOLN
INTRAMUSCULAR | Status: DC | PRN
  Administered 2014-11-14: 4 mg via INTRAVENOUS

## 2014-11-14 MED ORDER — FENTANYL CITRATE (PF) 100 MCG/2ML IJ SOLN
INTRAMUSCULAR | Status: AC
  Administered 2014-11-14: 50 ug via INTRAVENOUS
  Filled 2014-11-14: qty 2

## 2014-11-14 MED ORDER — ACETAMINOPHEN 325 MG PO TABS: 325.0000 mg | ORAL_TABLET | ORAL | Status: DC | PRN

## 2014-11-14 MED ORDER — OXYCODONE HCL 5 MG/5ML PO SOLN: 5.0000 mg | Freq: Once | ORAL | Status: DC | PRN

## 2014-11-14 MED ORDER — HYDROCODONE-ACETAMINOPHEN 5-325 MG PO TABS
ORAL_TABLET | ORAL | Status: AC
  Filled 2014-11-14: qty 1

## 2014-11-14 MED ORDER — LIDOCAINE HCL (CARDIAC) 20 MG/ML IV SOLN
INTRAVENOUS | Status: DC | PRN
  Administered 2014-11-14: 40 mg via INTRAVENOUS

## 2014-11-14 MED ORDER — OXYCODONE HCL 5 MG PO TABS: 5.0000 mg | ORAL_TABLET | Freq: Once | ORAL | Status: DC | PRN

## 2014-11-14 MED ORDER — HYDROCODONE-ACETAMINOPHEN 5-325 MG PO TABS
1.0000 | ORAL_TABLET | Freq: Once | ORAL | Status: AC
  Administered 2014-11-14: 1 via ORAL

## 2014-11-14 MED ORDER — IBUPROFEN 600 MG PO TABS: 600.0000 mg | ORAL_TABLET | Freq: Four times a day (QID) | ORAL | Status: DC | PRN

## 2014-11-14 MED ORDER — BUPIVACAINE HCL 0.5 % IJ SOLN
INTRAMUSCULAR | Status: DC | PRN
  Administered 2014-11-14: 30 mL

## 2014-11-14 MED ORDER — DEXAMETHASONE SODIUM PHOSPHATE 4 MG/ML IJ SOLN
INTRAMUSCULAR | Status: DC | PRN
  Administered 2014-11-14: 4 mg via INTRAVENOUS

## 2014-11-14 MED ORDER — FENTANYL CITRATE (PF) 100 MCG/2ML IJ SOLN
INTRAMUSCULAR | Status: DC | PRN
Start: 2014-11-14 — End: 2014-11-14
  Administered 2014-11-14 (×2): 50 ug via INTRAVENOUS

## 2014-11-14 MED ORDER — KETOROLAC TROMETHAMINE 30 MG/ML IJ SOLN
INTRAMUSCULAR | Status: DC | PRN
  Administered 2014-11-14: 15 mg via INTRAVENOUS

## 2014-11-14 MED ORDER — FENTANYL CITRATE (PF) 100 MCG/2ML IJ SOLN
25.0000 ug | INTRAMUSCULAR | Status: DC | PRN
  Administered 2014-11-14: 50 ug via INTRAVENOUS

## 2014-11-14 MED ORDER — HYDROCODONE-ACETAMINOPHEN 10-325 MG PO TABS: 1.0000 | ORAL_TABLET | Freq: Three times a day (TID) | ORAL | Status: DC

## 2014-11-14 SURGICAL SUPPLY — 19 items
CATH ROBINSON RED A/P 16FR (CATHETERS) ×3 IMPLANT
CLOTH BEACON ORANGE TIMEOUT ST (SAFETY) ×3 IMPLANT
DECANTER SPIKE VIAL GLASS SM (MISCELLANEOUS) ×3 IMPLANT
GLOVE BIOGEL PI IND STRL 7.0 (GLOVE) ×2 IMPLANT
GLOVE BIOGEL PI INDICATOR 7.0 (GLOVE) ×4
GLOVE ECLIPSE 7.0 STRL STRAW (GLOVE) ×3 IMPLANT
GOWN STRL REUS W/TWL LRG LVL3 (GOWN DISPOSABLE) ×6 IMPLANT
KIT BERKELEY 1ST TRIMESTER 3/8 (MISCELLANEOUS) ×3 IMPLANT
NS IRRIG 1000ML POUR BTL (IV SOLUTION) ×3 IMPLANT
PACK VAGINAL MINOR WOMEN LF (CUSTOM PROCEDURE TRAY) ×3 IMPLANT
PAD OB MATERNITY 4.3X12.25 (PERSONAL CARE ITEMS) ×3 IMPLANT
PAD PREP 24X48 CUFFED NSTRL (MISCELLANEOUS) ×3 IMPLANT
SET BERKELEY SUCTION TUBING (SUCTIONS) ×3 IMPLANT
TOWEL OR 17X24 6PK STRL BLUE (TOWEL DISPOSABLE) ×6 IMPLANT
VACURETTE 10 RIGID CVD (CANNULA) IMPLANT
VACURETTE 6 ASPIR F TIP BERK (CANNULA) IMPLANT
VACURETTE 7MM CVD STRL WRAP (CANNULA) IMPLANT
VACURETTE 8 RIGID CVD (CANNULA) IMPLANT
VACURETTE 9 RIGID CVD (CANNULA) ×2 IMPLANT

## 2014-11-14 NOTE — ED Notes (Signed)
[facility] and blood being administered.

## 2014-11-14 NOTE — Discharge Instructions (Signed)
Dilation and Curettage or Vacuum Curettage, Care After  Refer to this sheet in the next few weeks. These instructions provide you with information on caring for yourself after your procedure. Your health care provider may also give you more specific instructions. Your treatment has been planned according to current medical practices, but problems sometimes occur. Call your health care provider if you have any problems or questions after your procedure.  WHAT TO EXPECT AFTER THE PROCEDURE  After your procedure, it is typical to have light cramping and bleeding. This may last for 2 days to 2 weeks after the procedure.  HOME CARE INSTRUCTIONS   · Do not drive for 24 hours.  · Wait 1 week before returning to strenuous activities.  · Take your temperature 2 times a day for 4 days and write it down. Provide these temperatures to your health care provider if you develop a fever.  · Avoid long periods of standing.  · Avoid heavy lifting, pushing, or pulling. Do not lift anything heavier than 10 pounds (4.5 kg).  · Limit stair climbing to once or twice a day.  · Take rest periods often.  · You may resume your usual diet.  · Drink enough fluids to keep your urine clear or pale yellow.  · Your usual bowel function should return. If you have constipation, you may:  ¨ Take a mild laxative with permission from your health care provider.  ¨ Add fruit and bran to your diet.  ¨ Drink more fluids.  · Take showers instead of baths until your health care provider gives you permission to take baths.  · Do not go swimming or use a hot tub until your health care provider approves.  · Try to have someone with you or available to you the first 24-48 hours, especially if you were given a general anesthetic.  · Do not douche, use tampons, or have sex (intercourse) for 2 weeks after the procedure.  · Only take over-the-counter or prescription medicines as directed by your health care provider. Do not take aspirin. It can cause  bleeding.  · Follow up with your health care provider as directed.  SEEK MEDICAL CARE IF:   · You have increasing cramps or pain that is not relieved with medicine.  · You have abdominal pain that does not seem to be related to the same area of earlier cramping and pain.  · You have bad smelling vaginal discharge.  · You have a rash.  · You are having problems with any medicine.  SEEK IMMEDIATE MEDICAL CARE IF:   · You have bleeding that is heavier than a normal menstrual period.  · You have a fever.  · You have chest pain.  · You have shortness of breath.  · You feel dizzy or feel like fainting.  · You pass out.  · You have pain in your shoulder strap area.  · You have heavy vaginal bleeding with or without blood clots.  MAKE SURE YOU:   · Understand these instructions.  · Will watch your condition.  · Will get help right away if you are not doing well or get worse.  Document Released: 03/16/2000 Document Revised: 03/24/2013 Document Reviewed: 10/16/2012  Exit[employer]® Patient Information ©2015 [employer]. This information is not intended to replace advice given to you by your health care provider. Make sure you discuss any questions you have with your health care provider.

## 2014-11-14 NOTE — Anesthesia Preprocedure Evaluation (Signed)
Anesthesia Evaluation  Patient identified by MRN, date of birth, ID band Patient awake    Reviewed: Allergy & Precautions, NPO status , Patient's Chart, lab work & pertinent test results  History of Anesthesia Complications Negative for: history of anesthetic complications  Airway Mallampati: II  TM Distance: >3 FB Neck ROM: Full    Dental  (+) Teeth Intact   Pulmonary neg shortness of breath, neg sleep apnea, neg COPDneg recent URI, Current Smoker,  breath sounds clear to auscultation        Cardiovascular negative cardio ROS  Rhythm:Regular     Neuro/Psych PSYCHIATRIC DISORDERS Anxiety negative neurological ROS     GI/Hepatic negative GI ROS, Neg liver ROS,   Endo/Other  negative endocrine ROS  Renal/GU negative Renal ROS     Musculoskeletal   Abdominal   Peds  Hematology  (+) anemia ,   Anesthesia Other Findings   Reproductive/Obstetrics Retained products with bleeding                             Anesthesia Physical Anesthesia Plan  ASA: II and emergent  Anesthesia Plan: MAC   Post-op Pain Management:    Induction: Intravenous  Airway Management Planned: Nasal Cannula  Additional Equipment: None  Intra-op Plan:   Post-operative Plan:   Informed Consent: I have reviewed the patients History and Physical, chart, labs and discussed the procedure including the risks, benefits and alternatives for the proposed anesthesia with the patient or authorized representative who has indicated his/her understanding and acceptance.   Dental advisory given  Plan Discussed with: Surgeon and CRNA  Anesthesia Plan Comments:         Anesthesia Quick Evaluation

## 2014-11-14 NOTE — ED Notes (Signed)
Emergent blood to be started prior to transport.

## 2014-11-14 NOTE — Op Note (Signed)
[facility] PROCEDURE DATE: 11/14/2014  PREOPERATIVE DIAGNOSES: Incomplete abortion at [redacted]w[redacted]d with excessive hemorrhage; symptomatic anemia POSTOPERATIVE DIAGNOSIS: The same PROCEDURE:  Dilation and Evacuation SURGEON:  Dr. Jaynie Collins  INDICATIONS: 34 y.o. [id number] with incomplete abortion at [redacted]w[redacted]d with excessive hemorrhage and symptomatic anemia, needing surgical completion.  Please see preoperative notes for more details. Patient received two units of pRBCs preoperatively due to a hemoglobin of 6.6 and her symptoms.  Risks of surgery were discussed with the patient including but not limited to: bleeding which may require further transfusion; infection which may require antibiotics; injury to uterus or surrounding organs; need for additional procedures including laparotomy or laparoscopy; possibility of intrauterine scarring which may impair future fertility; and other postoperative/anesthesia complications. Written informed consent was obtained.    FINDINGS:  Enlarged uterus, large amounts of products of conception and blood clots, specimen sent to pathology.  ANESTHESIA:    Monitored intravenous sedation, paracervical block with 30 ml of 0.5% Marcaine. INTRAVENOUS FLUIDS:  500 ml of LR ESTIMATED BLOOD LOSS:  25 ml. SPECIMENS:  Products of conception sent to pathology COMPLICATIONS:  None immediate.  PROCEDURE DETAILS:  The patient preoperatively received intravenous Doxycycline.  She was then taken to the operating room where monitored intravenous sedation was administered and was found to be adequate.  After an adequate timeout was performed, she was placed in the dorsal lithotomy position and examined; then prepped and draped in the sterile manner.   Her bladder was catheterized for an unmeasured amount of clear, yellow urine.  A vaginal speculum was then placed in the patient's vagina and a single tooth tenaculum was applied to the anterior lip of the cervix.  A paracervical block using  30 ml of 0.5% Marcaine was administered. The cervix was already noted to be dilated and accommodated a 9 mm suction curette that was gently advanced to the uterine fundus.  The suction device was then activated and curette slowly rotated to clear the uterus of products of conception.  This was done several times given the amount of products present in the uterus.  A sharp curettage was then performed to confirm complete emptying of the uterus. There was minimal bleeding noted and the tenaculum removed with good hemostasis noted.   All instruments were removed from the patient's vagina.  Sponge and instrument counts were correct times two  The patient tolerated the procedure well and was taken to the recovery area awake, and in stable condition.  The patient will be discharged to home as per PACU criteria.  Routine postoperative instructions given.  She was prescribed Percocet, Ibuprofen and Colace.  She will follow up in the clinic in 2-3 weeks for postoperative evaluation.   Jaynie Collins, MD, FACOG Attending Obstetrician & Gynecologist Faculty [facility], [employer]

## 2014-11-14 NOTE — Anesthesia Postprocedure Evaluation (Signed)
  Anesthesia Post-op Note  Patient: Monica Jones  Procedure(s) Performed: Procedure(s): DILATATION AND EVACUATION (N/A)  Patient Location: PACU  Anesthesia Type:MAC  Level of Consciousness: awake  Airway and Oxygen Therapy: Patient Spontanous Breathing  Post-op Pain: none  Post-op Assessment: Post-op Vital signs reviewed, Patient's Cardiovascular Status Stable, Respiratory Function Stable, Patent Airway, No signs of Nausea or vomiting and Pain level controlled              Post-op Vital Signs: Reviewed and stable  Last Vitals:  Filed Vitals:   11/14/14 0445  BP: 97/52  Pulse: 75  Temp: 36.7 C  Resp: 16    Complications: No apparent anesthesia complications

## 2014-11-14 NOTE — Transfer of Care (Signed)
Immediate Anesthesia Transfer of Care Note  Patient: Monica Jones  Procedure(s) Performed: Procedure(s): DILATATION AND EVACUATION (N/A)  Patient Location: PACU  Anesthesia Type:MAC  Level of Consciousness: awake, alert  and oriented  Airway & Oxygen Therapy: Patient Spontanous Breathing  Post-op Assessment: Report given to RN and Post -op Vital signs reviewed and stable  Post vital signs: Reviewed and stable  Last Vitals:  Filed Vitals:   11/14/14 0107  BP:   Pulse:   Temp: 36.8 C  Resp:     Complications: No apparent anesthesia complications

## 2014-11-14 NOTE — ED Notes (Signed)
[facility]in

## 2014-11-14 NOTE — MAU Note (Signed)
Late Entry: Patient arrived to MAU at 0124 via [facility].  2 units of blood infusing on arrival.  Patient ready for OR.  Dr. Macon Large at bedside for patients arrival.  Patient signed consent form for OR and transferred via MAU bed to the OR.

## 2014-11-15 ENCOUNTER — Encounter (HOSPITAL_COMMUNITY): Payer: Self-pay | Admitting: Obstetrics & Gynecology

## 2014-11-16 LAB — TYPE AND SCREEN
ABO/RH(D): O POS
Antibody Screen: NEGATIVE
UNIT DIVISION: 0
Unit division: 0
Unit division: 0
Unit division: 0

## 2014-11-18 NOTE — Addendum Note (Signed)
Addendum  created 11/18/14 1711 by Elbert Ewings, CRNA   Modules edited: Anesthesia Events, Narrator   Narrator:  Narrator: Event Log Edited

## 2014-11-23 ENCOUNTER — Telehealth: Payer: Self-pay | Admitting: General Practice

## 2014-11-23 NOTE — Telephone Encounter (Signed)
Patient called and left message stating she had an emergent d&c on 8/15 due to hemorrhaging. Patient states she was given a prescription for norco but she was already on that medication and her pharmacy will not fill that prescription. Patient states she is needing something else for pain. Called pharmacy who states patient filled norco prescription on 8/9 for same quantity but for a different provider and that is why they are not filling the Rx. Called patient and discussed that at this point out from her surgery we would recommend the prescription strength motrin. Recommended she take that around the clock for a couple days every 6 hours to see if that helps and also recommended heating pad for comfort. Patient verbalized understanding and asked if she could go with her son to carowinds this weekend. Told patient we wouldn't recommend rollercoasters but she could certainly go and walk around as long as she takes frequent breaks and stays well hydrated. Patient verbalized understanding and had no questions

## 2014-11-29 ENCOUNTER — Ambulatory Visit: Payer: Medicaid Other | Admitting: Obstetrics and Gynecology

## 2014-12-03 ENCOUNTER — Encounter: Payer: Self-pay | Admitting: Obstetrics and Gynecology

## 2014-12-23 ENCOUNTER — Ambulatory Visit: Payer: Medicaid Other | Admitting: Obstetrics & Gynecology

## 2016-11-10 IMAGING — US US OB COMP LESS 14 WK
1 series · 14 of 28 positions shown · non-contrast
Comparison: None.

CLINICAL DATA: Recent spontaneous abortion. Heavy vaginal bleeding.

EXAM:
OBSTETRIC <14 WK US AND TRANSVAGINAL OB US
TECHNIQUE: Both transabdominal and transvaginal ultrasound examinations were
performed for complete evaluation of the gestation as well as the
maternal uterus, adnexal regions, and pelvic cul-de-sac.
Transvaginal technique was performed to assess early pregnancy.

[Series 1: us ob comp less 14 wk · 0.18mm/px · 14 of 76 slices shown]
[im 3/76]
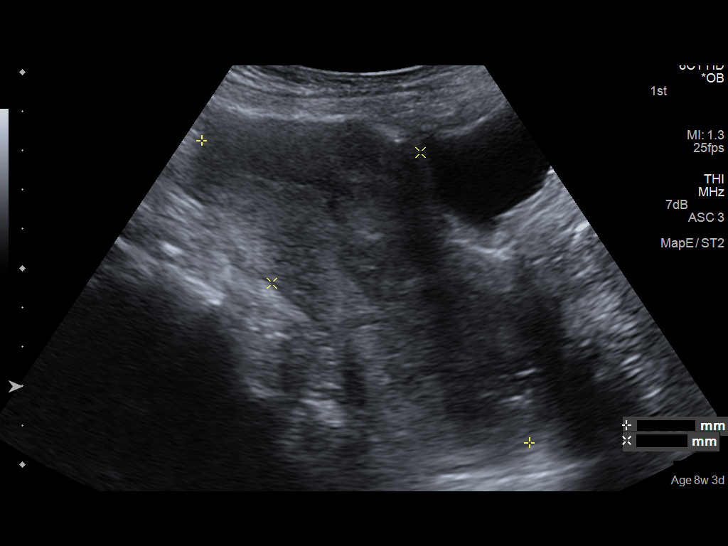
[im 9/76]
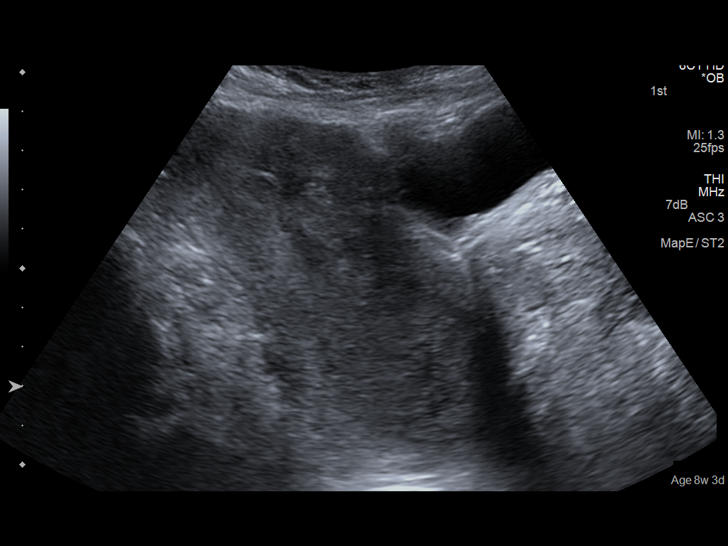
[im 14/76]
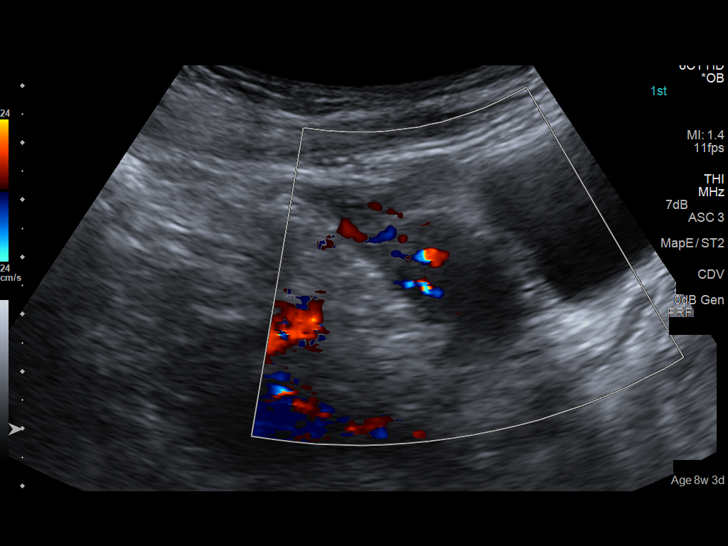
[im 20/76]
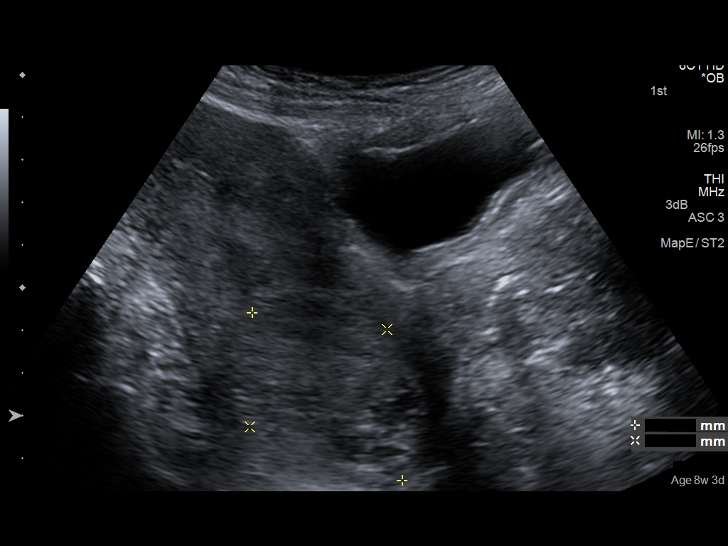
[im 26/76]
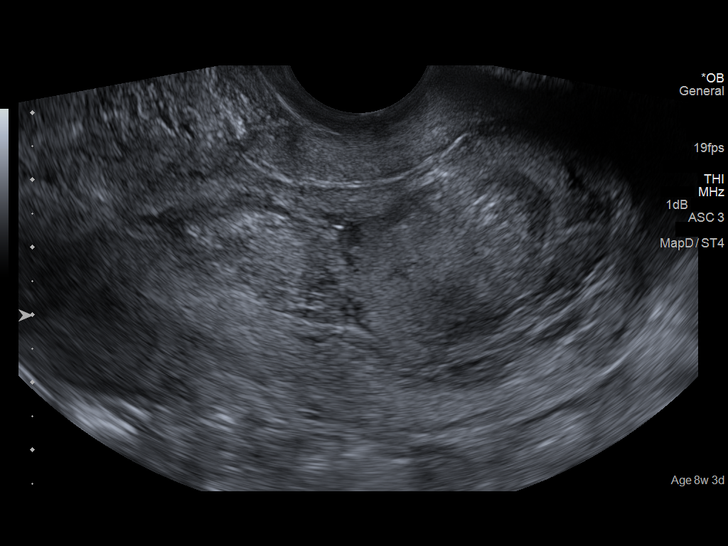
[im 31/76]
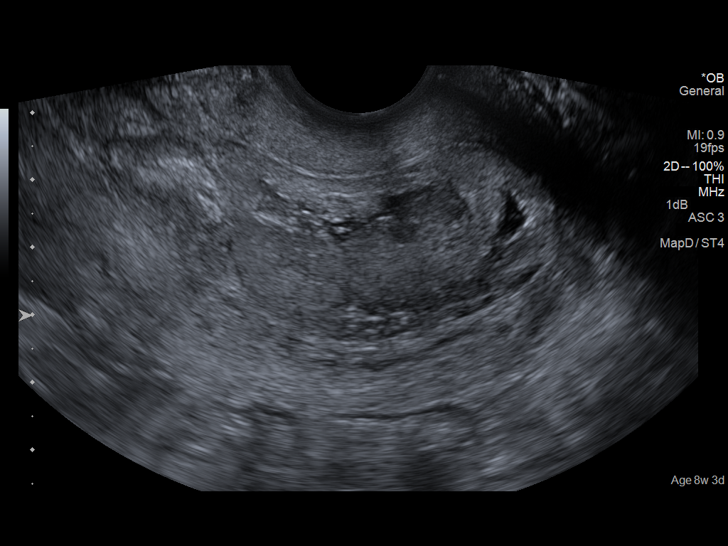
[im 37/76]
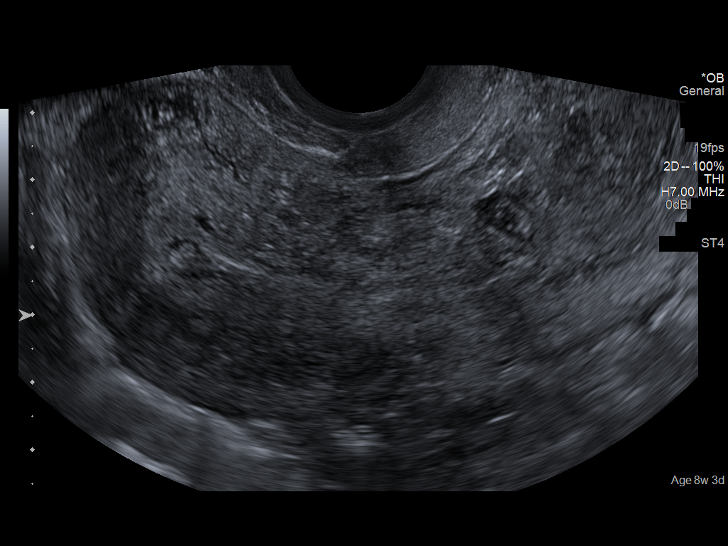
[im 42/76]
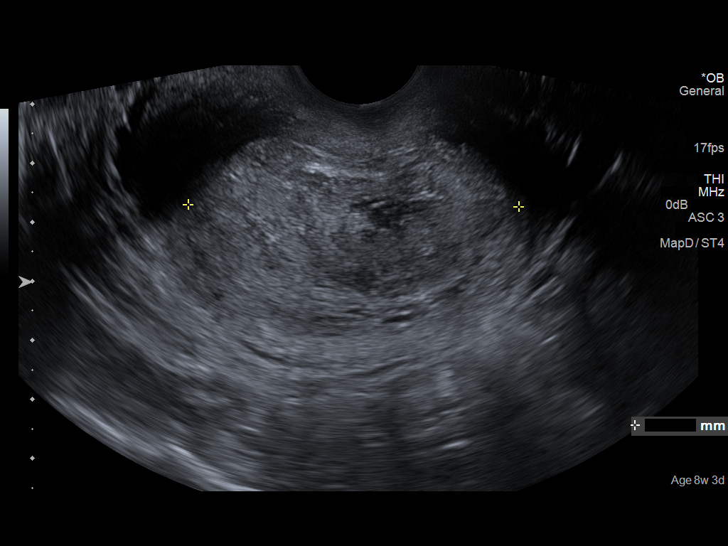
[im 48/76]
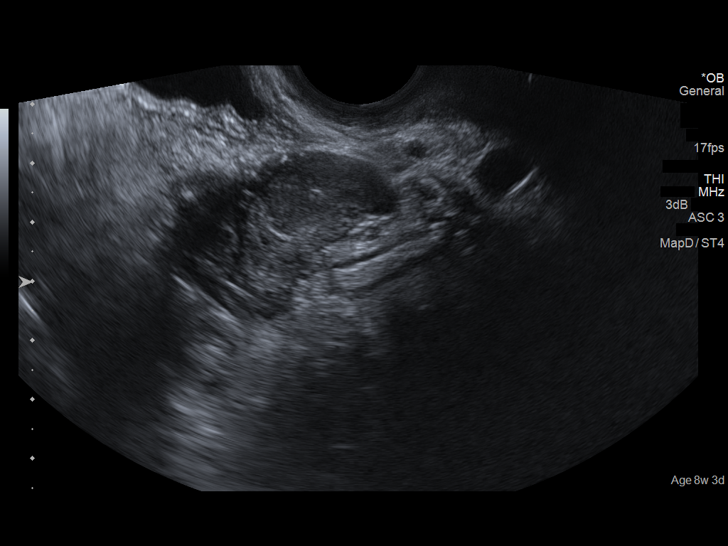
[im 53/76]
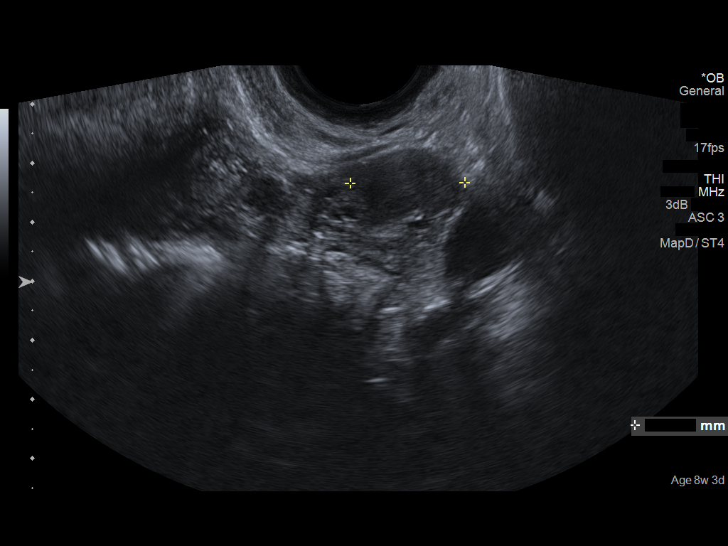
[im 59/76]
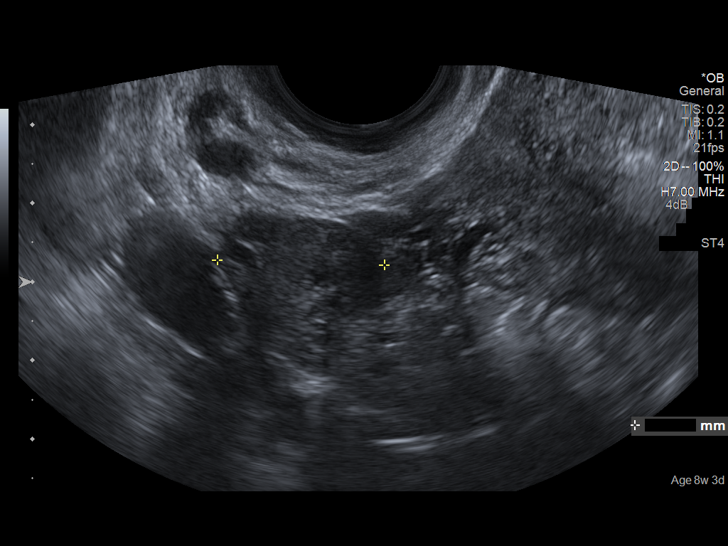
[im 64/76]
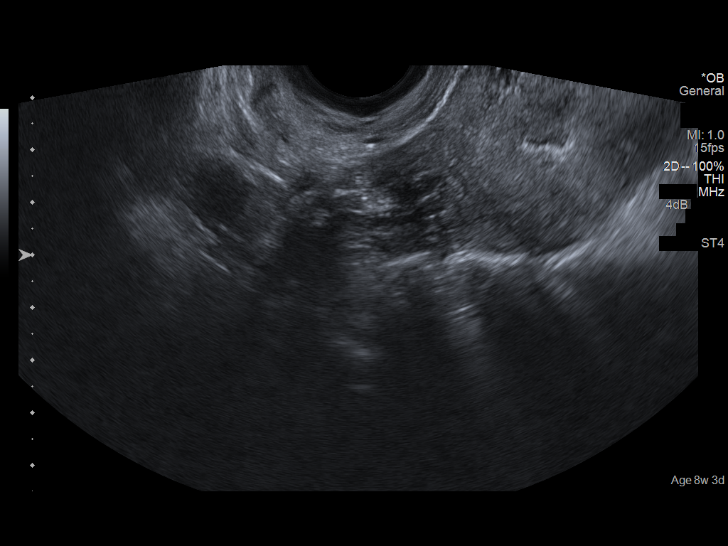
[im 70/76]
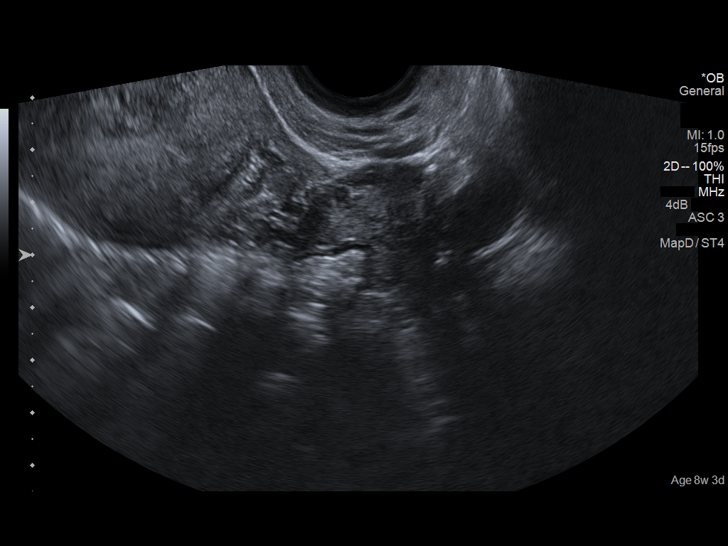
[im 76/76]
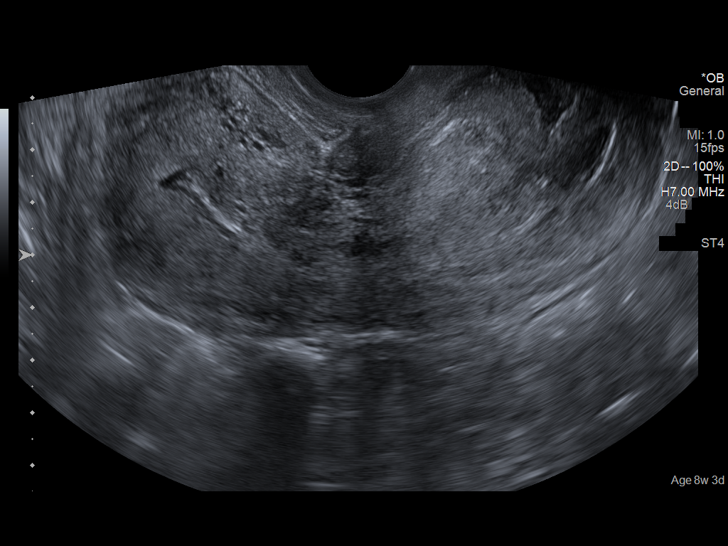

[14 of 28 positions shown; findings below may reference images not displayed]

FINDINGS: Intrauterine gestational sac: No gestational sac is present.

Maternal uterus/adnexae: Heterogeneous soft tissue is present within
the endometrial canal measuring 6.8 x 3.0 x 5.6 cm. There is no
significant color Doppler flow over this area.

The right ovary is of normal size and echotexture measuring 2.1 x
1.4 x 2.1 cm. The left ovary is of normal size and echotexture
measuring 1.9 x 1.3 x 1.9 cm.

Trace free fluid is present.
IMPRESSION: 1. Heterogeneous soft tissue within the endometrial canal without a
gestational sac. There is no increased flow. This likely represents
residual blood products/hematoma.
2. Trace free fluid.

## 2022-05-22 ENCOUNTER — Ambulatory Visit
Admission: EM | Admit: 2022-05-22 | Discharge: 2022-05-22 | Disposition: A | Discharge status: Home / Self Care | Payer: Medicaid Other | Attending: Family Medicine | Admitting: Family Medicine

## 2022-05-22 DIAGNOSIS — K047 Periapical abscess without sinus: Secondary | ICD-10-CM | POA: Diagnosis not present

## 2022-05-22 MED ORDER — AMOXICILLIN-POT CLAVULANATE 875-125 MG PO TABS: 1.0000 | ORAL_TABLET | Freq: Two times a day (BID) | ORAL | 0 refills | Status: AC

## 2022-05-22 MED ORDER — IBUPROFEN 800 MG PO TABS: 800.0000 mg | ORAL_TABLET | Freq: Three times a day (TID) | ORAL | 0 refills | Status: AC

## 2022-05-22 MED ORDER — CEFTRIAXONE SODIUM 1 G IJ SOLR
1.0000 g | Freq: Once | INTRAMUSCULAR | Status: AC
  Administered 2022-05-22: 1 g via INTRAMUSCULAR

## 2022-05-22 NOTE — ED Provider Notes (Signed)
[name]    CSN: [id number] Arrival date & time: 05/22/22  1015      History   Chief Complaint Chief Complaint  Patient presents with   Dental Pain    HPI Monica Jones is a 42 y.o. female.   HPI Pleasant 42 year old female presents with right lower dental pain that started yesterday, 05/21/2022 it is worsened overnight.  Patient reports right facial swelling over cheek as well, that is obvious at first site.  PMH significant for previous dental abscess of right lower mandibular area, nicotine dependence, anxiety, and ADHD.  Past Medical History:  Diagnosis Date   Anxiety     Patient Active Problem List   Diagnosis Date Noted   Incomplete abortion with excessive hemorrhage 11/14/2014   Acute blood loss anemia 11/14/2014    Past Surgical History:  Procedure Laterality Date   c sections     DILATION AND EVACUATION N/A 11/14/2014   Procedure: DILATATION AND EVACUATION;  Surgeon: Osborne Oman, MD;  Location: [facility];  Service: Gynecology;  Laterality: N/A;   HAND SURGERY     LEG SURGERY     TONSILLECTOMY      OB History     Gravida  3   Para  2   Term  2   Preterm      AB  1   Living  2      SAB  1   IAB      Ectopic      Multiple      Live Births  2            Home Medications    Prior to Admission medications   Medication Sig Start Date End Date Taking? Authorizing Provider  amoxicillin-clavulanate (AUGMENTIN) 875-125 MG tablet Take 1 tablet by mouth 2 (two) times daily for 10 days. 05/22/22 06/01/22 Yes Eliezer Lofts, FNP  ibuprofen (ADVIL) 800 MG tablet Take 1 tablet (800 mg total) by mouth 3 (three) times daily. 05/22/22  Yes Eliezer Lofts, [place]    Family History History reviewed. No pertinent family history.  Social History Social History   Tobacco Use   Smoking status: Every Day    Packs/day: 0.50    Types: Cigarettes   Smokeless tobacco: Never  Substance Use Topics   Alcohol use: Yes   Drug use: No      Allergies   Bee venom, Adhesive [tape], Oxycodone-acetaminophen, and Ultram [tramadol]   Review of Systems Review of Systems  HENT:  Positive for dental problem.   All other systems reviewed and are negative.    Physical Exam Triage Vital Signs ED Triage Vitals  Enc Vitals Group     BP      Pulse      Resp      Temp      Temp src      SpO2      Weight      Height      Head Circumference      Peak Flow      Pain Score      Pain Loc      Pain Edu?      Excl. in [place]?    No data found.  Updated Vital Signs BP 125/78 (BP Location: Right Arm)   Pulse 79   Temp (!) 97.5 F (36.4 C) (Oral)   Resp 16   LMP 05/17/2022 (Exact Date)   SpO2 96%     Physical Exam Vitals  and nursing note reviewed.  Constitutional:      Appearance: Normal appearance. She is normal weight. She is ill-appearing.  HENT:     Head: Normocephalic and atraumatic.     Right Ear: Tympanic membrane, ear canal and external ear normal.     Left Ear: Tympanic membrane, ear canal and external ear normal.     Mouth/Throat:     Mouth: Mucous membranes are moist.     Pharynx: Oropharynx is clear.     Comments: Right lower mandibular area: missing third, second, first molars and second bicuspid; erythematous, mildly indurated gingival lining of first bicuspid with moderate soft tissue of the right cheek noted Eyes:     Extraocular Movements: Extraocular movements intact.     Conjunctiva/sclera: Conjunctivae normal.     Pupils: Pupils are equal, round, and reactive to light.  Cardiovascular:     Rate and Rhythm: Normal rate and regular rhythm.     Pulses: Normal pulses.     Heart sounds: Normal heart sounds.  Pulmonary:     Effort: Pulmonary effort is normal.     Breath sounds: Normal breath sounds. No wheezing, rhonchi or rales.  Musculoskeletal:        General: Normal range of motion.     Cervical back: Normal range of motion and neck supple. No tenderness.  Lymphadenopathy:     Cervical:  No cervical adenopathy.  Skin:    General: Skin is warm and dry.  Neurological:     General: No focal deficit present.     Mental Status: She is alert and oriented to person, place, and time. Mental status is at baseline.      UC Treatments / Results  Labs (all labs ordered are listed, but only abnormal results are displayed) Labs Reviewed - No data to display  EKG   Radiology No results found.  Procedures Procedures (including critical care time)  Medications Ordered in UC Medications  cefTRIAXone (ROCEPHIN) injection 1 g (1 g Intramuscular Given 05/22/22 1045)    Initial Impression / Assessment and Plan / UC Course  I have reviewed the triage vital signs and the nursing notes.  Pertinent labs & imaging results that were available during my care of the patient were reviewed by me and considered in my medical decision making (see chart for details).     MDM: 1.  Dental abscess-1 g Rocephin administered IM in clinic this morning and prior to discharge; Rx'd Augmentin 875 mg twice daily x 10 days. Instructed patient to take medications as directed with food to completion.  Advised may use ibuprofen daily or as needed for right lower dental pain.  Encouraged patient to be evaluated by dentist as soon as possible.  Encouraged increase daily water intake to 64 ounces per day while taking these medications.  Advised if symptoms worsen and/or unresolved please follow-up with dentist or here for further evaluation.  Patient discharged home, hemodynamically stable.  Final Clinical Impressions(s) / UC Diagnoses   Final diagnoses:  Dental abscess     Discharge Instructions      Instructed patient to take medications as directed with food to completion.  Advised may use ibuprofen daily or as needed for right lower dental pain.  Encouraged patient to be evaluated by dentist as soon as possible.  Encouraged increase daily water intake to 64 ounces per day while taking these  medications.  Advised if symptoms worsen and/or unresolved please follow-up with dentist or here for further evaluation.  ED Prescriptions     Medication Sig Dispense Auth. Provider   amoxicillin-clavulanate (AUGMENTIN) 875-125 MG tablet Take 1 tablet by mouth 2 (two) times daily for 10 days. 20 tablet Eliezer Lofts, FNP   ibuprofen (ADVIL) 800 MG tablet Take 1 tablet (800 mg total) by mouth 3 (three) times daily. 30 tablet Eliezer Lofts, FNP      PDMP not reviewed this encounter.   Eliezer Lofts, [place] 05/22/22 1156

## 2022-05-22 NOTE — ED Triage Notes (Signed)
Patient with c/o right lower dental pain that started yesterday (2/19). Patient states the pain has worsened overnight.

## 2022-05-22 NOTE — Discharge Instructions (Addendum)
Instructed patient to take medications as directed with food to completion.  Advised may use ibuprofen daily or as needed for right lower dental pain.  Encouraged patient to be evaluated by dentist as soon as possible.  Encouraged increase daily water intake to 64 ounces per day while taking these medications.  Advised if symptoms worsen and/or unresolved please follow-up with dentist or here for further evaluation.
